# Patient Record
Sex: Female | Born: 1945 | Race: White | Hispanic: No | Marital: Single | Smoking: Current every day smoker
Health system: Southern US, Community
[De-identification: ages and names within clinical notes are randomized; demographics above are authoritative.]

## PROBLEM LIST (undated history)

## (undated) DIAGNOSIS — M810 Age-related osteoporosis without current pathological fracture: Secondary | ICD-10-CM

## (undated) DIAGNOSIS — F419 Anxiety disorder, unspecified: Secondary | ICD-10-CM

## (undated) DIAGNOSIS — F329 Major depressive disorder, single episode, unspecified: Secondary | ICD-10-CM

## (undated) DIAGNOSIS — E559 Vitamin D deficiency, unspecified: Secondary | ICD-10-CM

## (undated) DIAGNOSIS — R531 Weakness: Secondary | ICD-10-CM

## (undated) DIAGNOSIS — F32A Depression, unspecified: Secondary | ICD-10-CM

## (undated) DIAGNOSIS — N3281 Overactive bladder: Secondary | ICD-10-CM

## (undated) DIAGNOSIS — R413 Other amnesia: Secondary | ICD-10-CM

## (undated) DIAGNOSIS — K219 Gastro-esophageal reflux disease without esophagitis: Secondary | ICD-10-CM

## (undated) DIAGNOSIS — D696 Thrombocytopenia, unspecified: Secondary | ICD-10-CM

## (undated) DIAGNOSIS — J449 Chronic obstructive pulmonary disease, unspecified: Secondary | ICD-10-CM

## (undated) DIAGNOSIS — F259 Schizoaffective disorder, unspecified: Secondary | ICD-10-CM

## (undated) DIAGNOSIS — E039 Hypothyroidism, unspecified: Secondary | ICD-10-CM

## (undated) DIAGNOSIS — E569 Vitamin deficiency, unspecified: Secondary | ICD-10-CM

## (undated) DIAGNOSIS — N390 Urinary tract infection, site not specified: Secondary | ICD-10-CM

## (undated) DIAGNOSIS — F209 Schizophrenia, unspecified: Secondary | ICD-10-CM

## (undated) DIAGNOSIS — R339 Retention of urine, unspecified: Secondary | ICD-10-CM

## (undated) HISTORY — DX: Other amnesia: R41.3

## (undated) HISTORY — PX: BREAST SURGERY: SHX581

## (undated) HISTORY — DX: Depression, unspecified: F32.A

## (undated) HISTORY — DX: Major depressive disorder, single episode, unspecified: F32.9

## (undated) HISTORY — DX: Anxiety disorder, unspecified: F41.9

---

## 2003-12-15 ENCOUNTER — Emergency Department: Payer: Self-pay | Admitting: Internal Medicine

## 2004-05-30 ENCOUNTER — Inpatient Hospital Stay: Payer: Self-pay | Admitting: Orthopedic Surgery

## 2004-06-06 ENCOUNTER — Other Ambulatory Visit: Payer: Self-pay

## 2004-06-13 ENCOUNTER — Ambulatory Visit: Payer: Self-pay | Admitting: Internal Medicine

## 2004-06-19 ENCOUNTER — Ambulatory Visit: Payer: Self-pay | Admitting: Internal Medicine

## 2005-02-20 ENCOUNTER — Ambulatory Visit: Payer: Self-pay | Admitting: Internal Medicine

## 2005-04-09 ENCOUNTER — Inpatient Hospital Stay: Payer: Self-pay | Admitting: Internal Medicine

## 2005-04-09 ENCOUNTER — Other Ambulatory Visit: Payer: Self-pay

## 2005-04-15 ENCOUNTER — Emergency Department: Payer: Self-pay | Admitting: Emergency Medicine

## 2005-06-30 ENCOUNTER — Inpatient Hospital Stay: Payer: Self-pay | Admitting: Specialist

## 2005-06-30 ENCOUNTER — Other Ambulatory Visit: Payer: Self-pay

## 2005-09-12 ENCOUNTER — Emergency Department: Payer: Self-pay | Admitting: Emergency Medicine

## 2006-03-08 ENCOUNTER — Inpatient Hospital Stay: Payer: Self-pay | Admitting: Internal Medicine

## 2006-03-08 ENCOUNTER — Other Ambulatory Visit: Payer: Self-pay

## 2006-03-10 ENCOUNTER — Other Ambulatory Visit: Payer: Self-pay

## 2006-03-11 ENCOUNTER — Other Ambulatory Visit: Payer: Self-pay

## 2006-12-04 ENCOUNTER — Encounter: Payer: Self-pay | Admitting: Anesthesiology

## 2007-02-16 IMAGING — CR DG ABDOMEN 2V
1 series · 3 of 3 positions shown · non-contrast
Comparison: none

REASON FOR EXAM: Pain
COMMENTS:

[Series 1: view not recorded · 0.17mm/px · 3 of 3 slices shown]
[im 1/3]
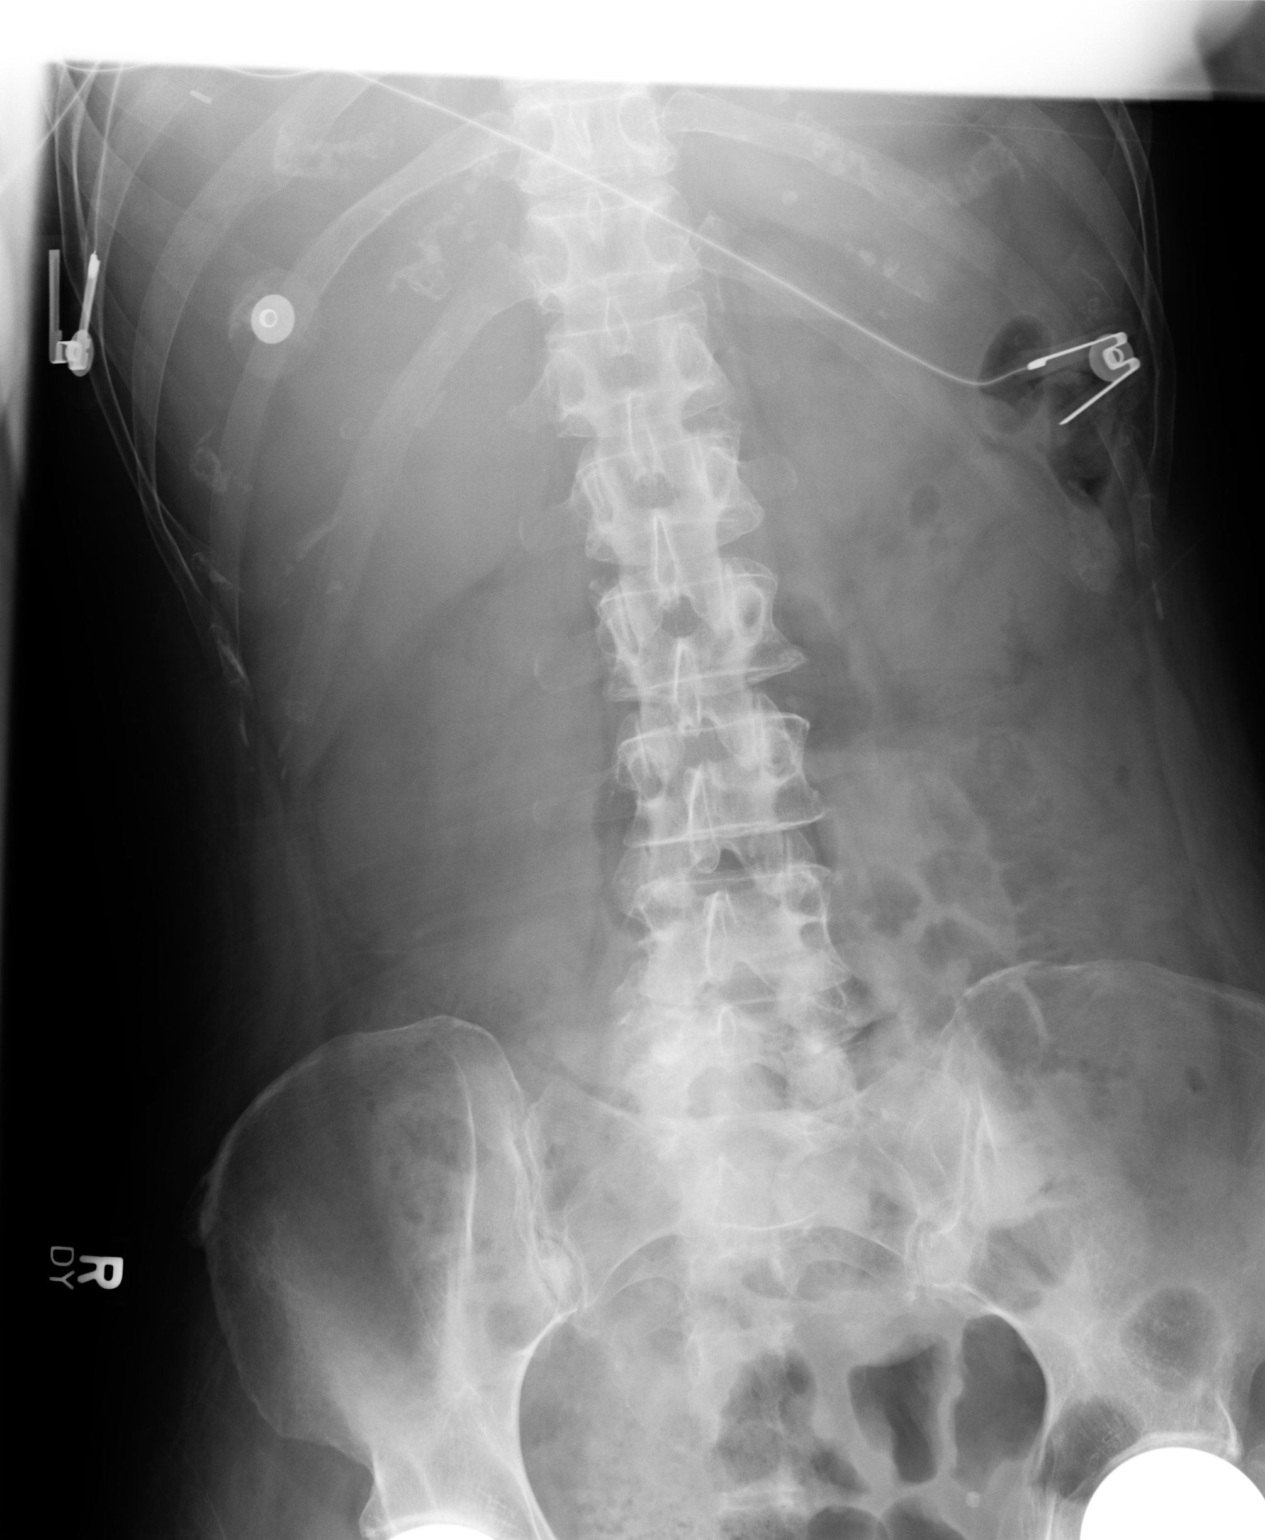
[im 2/3]
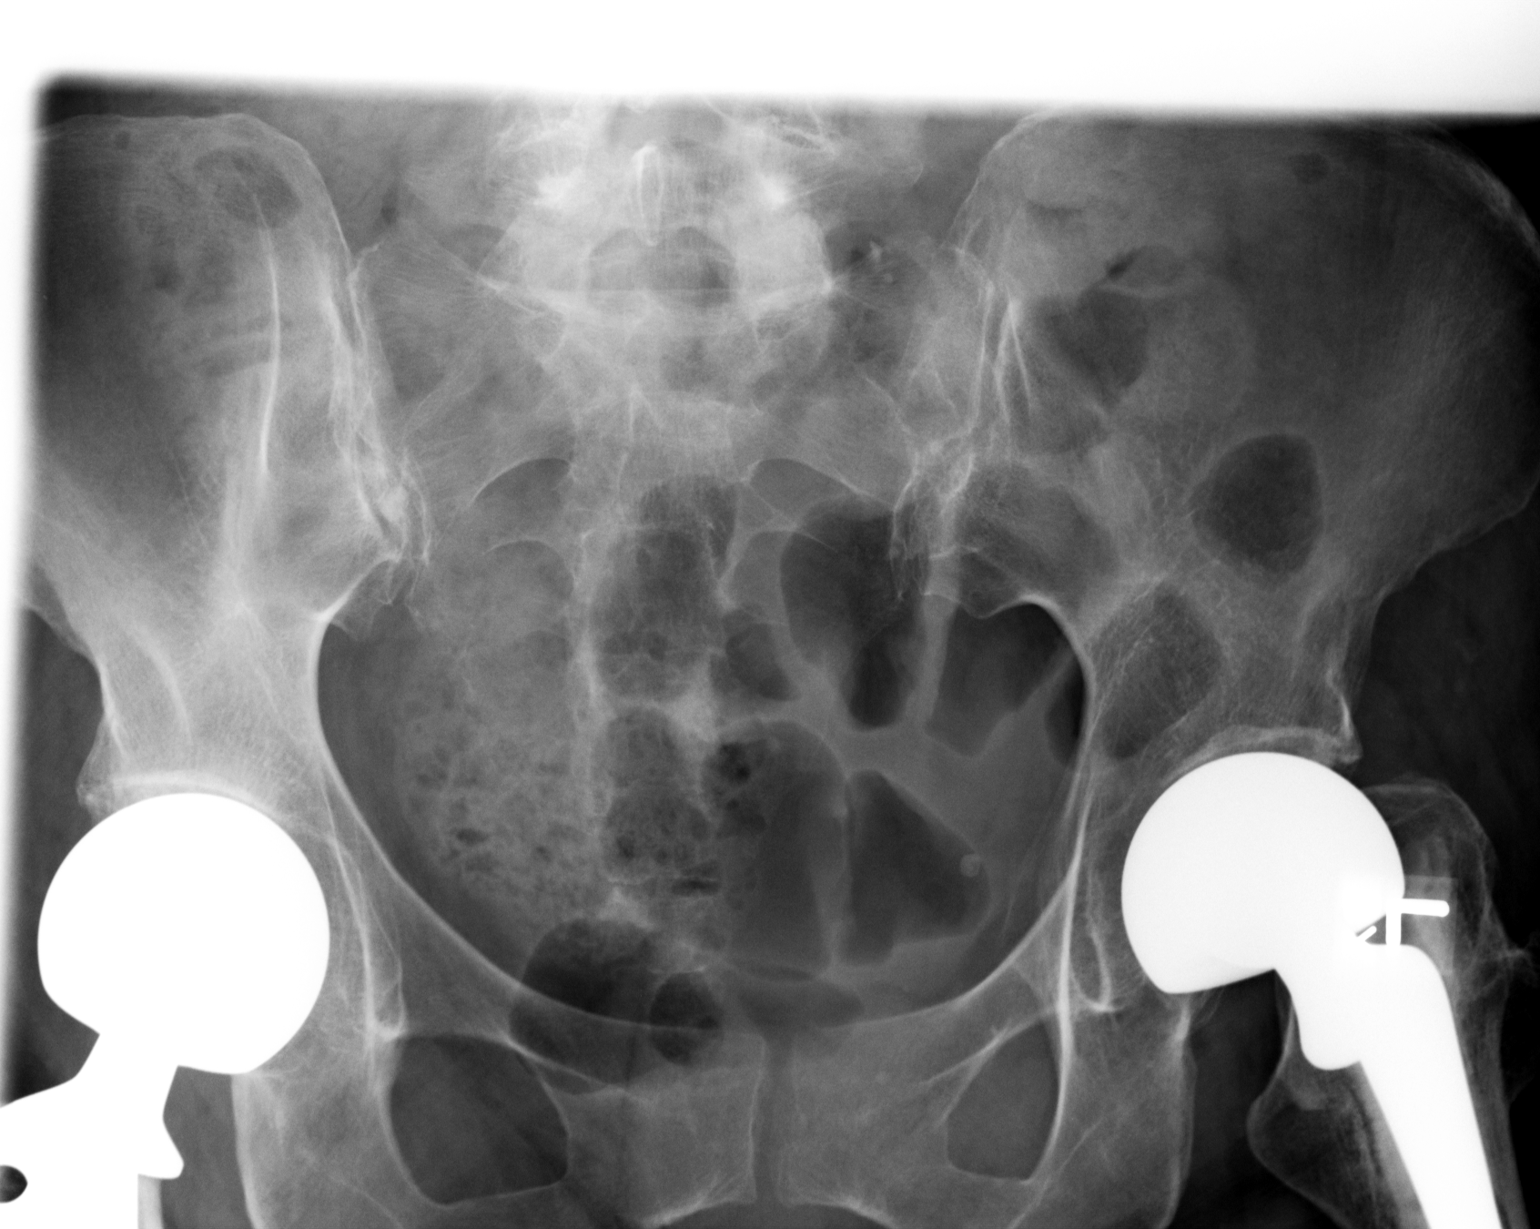
[im 3/3]
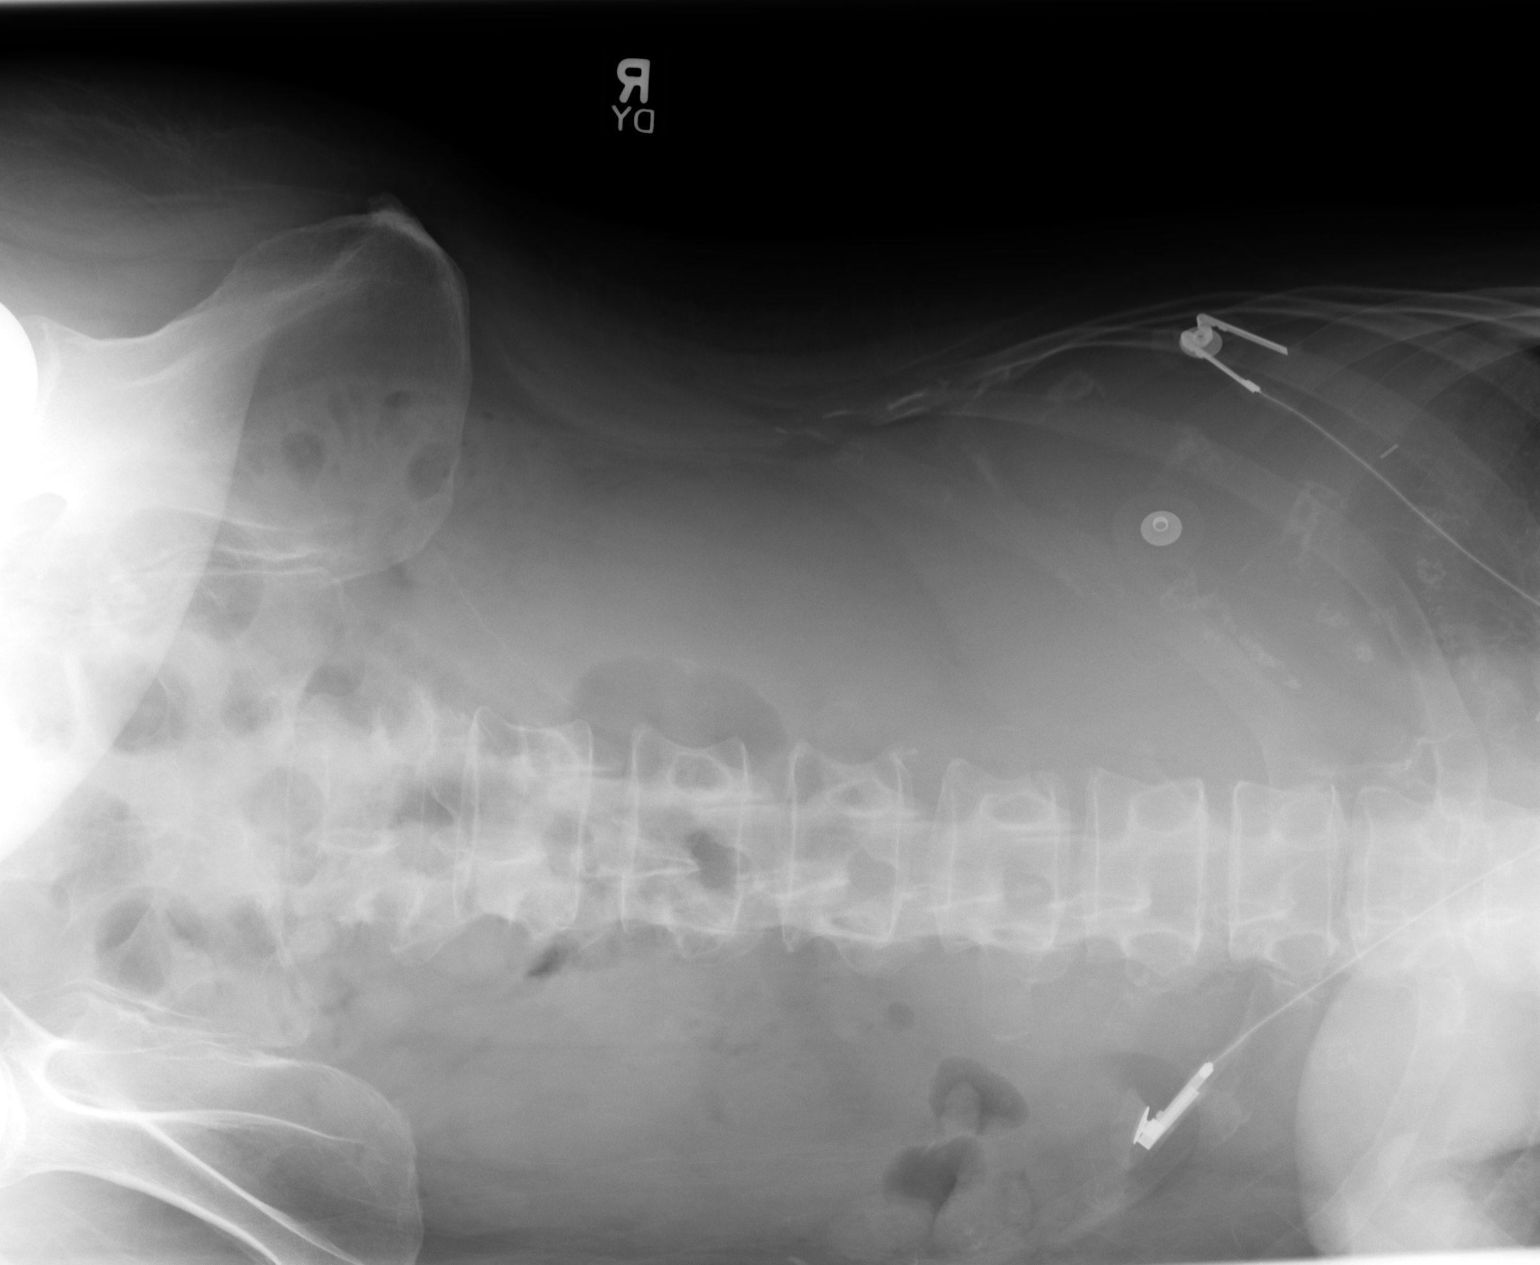

[3 of 3 positions shown; findings below may reference images not displayed]

PROCEDURE:     DXR - DXR ABDOMEN 2 V FLAT AND ERECT  - March 16, 2006  [DATE]

RESULT:

Supine and left-side-down decubitus films reveal a relatively nonspecific
bowel gas pattern. I see no evidence of obstruction or ileus. There are
prosthetic hips bilaterally. I see no abnormal soft tissue calcification.
IMPRESSION: The bowel gas pattern is nonspecific. I do not see evidence of obstruction
or ileus or perforation. Followup films are recommended if  the patient's
symptoms persist.

## 2007-06-23 ENCOUNTER — Emergency Department: Payer: Self-pay | Admitting: Emergency Medicine

## 2007-09-13 ENCOUNTER — Emergency Department: Payer: Self-pay | Admitting: Emergency Medicine

## 2007-10-05 ENCOUNTER — Inpatient Hospital Stay: Payer: Self-pay | Admitting: Internal Medicine

## 2008-01-04 ENCOUNTER — Inpatient Hospital Stay: Payer: Self-pay | Admitting: Internal Medicine

## 2008-01-07 ENCOUNTER — Inpatient Hospital Stay: Payer: Self-pay | Admitting: Unknown Physician Specialty

## 2008-04-06 ENCOUNTER — Emergency Department: Payer: Self-pay | Admitting: Emergency Medicine

## 2008-08-09 ENCOUNTER — Ambulatory Visit: Payer: Self-pay | Admitting: Anesthesiology

## 2010-05-02 ENCOUNTER — Emergency Department: Payer: Self-pay | Admitting: Emergency Medicine

## 2010-11-08 ENCOUNTER — Inpatient Hospital Stay: Payer: Self-pay | Admitting: Internal Medicine

## 2011-06-18 LAB — COMPREHENSIVE METABOLIC PANEL
Albumin: 2.7 g/dL — ABNORMAL LOW (ref 3.4–5.0)
BUN: 20 mg/dL — ABNORMAL HIGH (ref 7–18)
Bilirubin,Total: 0.3 mg/dL (ref 0.2–1.0)
Calcium, Total: 8.6 mg/dL (ref 8.5–10.1)
Chloride: 105 mmol/L (ref 98–107)
Co2: 29 mmol/L (ref 21–32)
Creatinine: 0.82 mg/dL (ref 0.60–1.30)
EGFR (Non-African Amer.): >60
Glucose: 105 mg/dL — ABNORMAL HIGH (ref 65–99)
Potassium: 3.8 mmol/L (ref 3.5–5.1)
SGOT(AST): 17 U/L (ref 15–37)
SGPT (ALT): 8 U/L — ABNORMAL LOW
Total Protein: 6.4 g/dL (ref 6.4–8.2)

## 2011-06-18 LAB — CBC
MCH: 29.6 pg (ref 26.0–34.0)
MCHC: 33 g/dL (ref 32.0–36.0)
MCV: 90 fL (ref 80–100)
RBC: 4.69 10*6/uL (ref 3.80–5.20)
RDW: 14.1 % (ref 11.5–14.5)
WBC: 9.9 10*3/uL (ref 3.6–11.0)

## 2011-06-18 LAB — URINALYSIS, COMPLETE
Blood: NEGATIVE
Glucose,UR: NEGATIVE mg/dL (ref 0–75)
Leukocyte Esterase: NEGATIVE
Nitrite: NEGATIVE
Ph: 6 (ref 4.5–8.0)
RBC,UR: 7 /HPF (ref 0–5)
Specific Gravity: 1.021 (ref 1.003–1.030)
Squamous Epithelial: 3
WBC UR: 1 /HPF (ref 0–5)

## 2011-06-18 LAB — DRUG SCREEN, URINE
Amphetamines, Ur Screen: NEGATIVE (ref ?–1000)
Barbiturates, Ur Screen: NEGATIVE (ref ?–200)
Opiate, Ur Screen: NEGATIVE (ref ?–300)

## 2011-06-18 LAB — TROPONIN I: Troponin-I: <0.02 ng/mL

## 2011-06-18 LAB — T4, FREE: Free Thyroxine: 1.04 ng/dL (ref 0.76–1.46)

## 2011-06-18 LAB — VALPROIC ACID LEVEL: Valproic Acid: 91 ug/mL

## 2011-06-18 LAB — ETHANOL
Ethanol %: <0.003 % (ref 0.000–0.080)
Ethanol: <3 mg/dL

## 2011-06-18 LAB — TSH: Thyroid Stimulating Horm: 3.71 u[IU]/mL

## 2011-06-18 LAB — SALICYLATE LEVEL: Salicylates, Serum: <1.7 mg/dL

## 2011-06-19 ENCOUNTER — Inpatient Hospital Stay: Payer: Self-pay | Admitting: *Deleted

## 2011-06-19 LAB — CBC WITH DIFFERENTIAL/PLATELET
Basophil %: 0.1 %
Eosinophil #: 0 10*3/uL (ref 0.0–0.7)
Eosinophil %: 0.6 %
HCT: 37.4 % (ref 35.0–47.0)
Lymphocyte %: 35 %
MCHC: 33.6 g/dL (ref 32.0–36.0)
Monocyte #: 0.8 x10 3/mm (ref 0.2–0.9)
Monocyte %: 11.1 %
Neutrophil %: 53.2 %
Platelet: 110 10*3/uL — ABNORMAL LOW (ref 150–440)
RDW: 13.9 % (ref 11.5–14.5)

## 2011-06-19 LAB — BASIC METABOLIC PANEL
Anion Gap: 6 — ABNORMAL LOW (ref 7–16)
BUN: 14 mg/dL (ref 7–18)
Calcium, Total: 8.2 mg/dL — ABNORMAL LOW (ref 8.5–10.1)
EGFR (Non-African Amer.): >60
Glucose: 118 mg/dL — ABNORMAL HIGH (ref 65–99)
Osmolality: 279 (ref 275–301)
Sodium: 139 mmol/L (ref 136–145)

## 2012-02-15 LAB — BASIC METABOLIC PANEL
Anion Gap: 6 — ABNORMAL LOW (ref 7–16)
Chloride: 102 mmol/L (ref 98–107)
Co2: 29 mmol/L (ref 21–32)
Creatinine: 0.88 mg/dL (ref 0.60–1.30)
EGFR (African American): >60
EGFR (Non-African Amer.): >60
Glucose: 130 mg/dL — ABNORMAL HIGH (ref 65–99)
Potassium: 4.3 mmol/L (ref 3.5–5.1)
Sodium: 137 mmol/L (ref 136–145)

## 2012-02-15 LAB — CBC
HCT: 41.3 % (ref 35.0–47.0)
MCH: 29.5 pg (ref 26.0–34.0)
MCHC: 32.7 g/dL (ref 32.0–36.0)
Platelet: 115 10*3/uL — ABNORMAL LOW (ref 150–440)
RBC: 4.58 10*6/uL (ref 3.80–5.20)

## 2012-02-16 ENCOUNTER — Inpatient Hospital Stay: Payer: Self-pay | Admitting: Internal Medicine

## 2012-02-16 LAB — URINALYSIS, COMPLETE
Nitrite: POSITIVE
Protein: 30
Specific Gravity: 1.012 (ref 1.003–1.030)
WBC UR: 196 /HPF (ref 0–5)

## 2012-02-16 LAB — RAPID INFLUENZA A&B ANTIGENS

## 2012-02-18 LAB — URINE CULTURE

## 2012-02-21 LAB — CULTURE, BLOOD (SINGLE)

## 2012-08-31 ENCOUNTER — Inpatient Hospital Stay: Payer: Self-pay | Admitting: Internal Medicine

## 2012-08-31 LAB — URINALYSIS, COMPLETE
Ketone: NEGATIVE
Nitrite: NEGATIVE
Protein: NEGATIVE
RBC,UR: <1 /HPF (ref 0–5)
WBC UR: 2 /HPF (ref 0–5)

## 2012-08-31 LAB — CBC WITH DIFFERENTIAL/PLATELET
Basophil #: 0 10*3/uL (ref 0.0–0.1)
Eosinophil #: 0 10*3/uL (ref 0.0–0.7)
Eosinophil %: 0.1 %
HCT: 36.5 % (ref 35.0–47.0)
HGB: 12.6 g/dL (ref 12.0–16.0)
Lymphocyte #: 1.6 10*3/uL (ref 1.0–3.6)
MCHC: 34.5 g/dL (ref 32.0–36.0)
MCV: 89 fL (ref 80–100)
Monocyte #: 2.6 x10 3/mm — ABNORMAL HIGH (ref 0.2–0.9)
Monocyte %: 20.6 %
Neutrophil #: 8.4 10*3/uL — ABNORMAL HIGH (ref 1.4–6.5)
Neutrophil %: 66.1 %
RBC: 4.11 10*6/uL (ref 3.80–5.20)

## 2012-08-31 LAB — COMPREHENSIVE METABOLIC PANEL
Albumin: 2.7 g/dL — ABNORMAL LOW (ref 3.4–5.0)
Alkaline Phosphatase: 87 U/L (ref 50–136)
Anion Gap: 9 (ref 7–16)
Calcium, Total: 8.8 mg/dL (ref 8.5–10.1)
Chloride: 101 mmol/L (ref 98–107)
Co2: 27 mmol/L (ref 21–32)
Creatinine: 0.89 mg/dL (ref 0.60–1.30)
Osmolality: 275 (ref 275–301)
Potassium: 3.6 mmol/L (ref 3.5–5.1)
SGPT (ALT): 12 U/L (ref 12–78)
Total Protein: 6.5 g/dL (ref 6.4–8.2)

## 2012-09-05 LAB — CULTURE, BLOOD (SINGLE)

## 2013-01-01 ENCOUNTER — Encounter: Payer: Self-pay | Admitting: Podiatrist

## 2013-01-08 ENCOUNTER — Ambulatory Visit: Payer: Self-pay | Admitting: Podiatrist

## 2013-01-29 ENCOUNTER — Ambulatory Visit: Payer: Self-pay | Admitting: Podiatrist

## 2014-04-06 ENCOUNTER — Inpatient Hospital Stay: Payer: Self-pay | Admitting: Internal Medicine

## 2014-04-13 ENCOUNTER — Emergency Department: Payer: Self-pay | Admitting: Emergency Medicine

## 2014-06-08 NOTE — H&P (Signed)
PATIENT NAME:  Tina Ellison, Tina Ellison MR#:  [id number] DDaine FlorasE OF BIRTH:  September 04, 1945  DATE OF ADMISSION:  02/16/2012  PRIMARY CARE PHYSICIAN: Mayer Maskerobert Mead, MD  REFERRING PHYSICIAN: Lucrezia EuropeAllison Webster, MD.   CHIEF COMPLAINT: Altered mental status.   HISTORY OF PRESENT ILLNESS:  The patient is a 69 year old Caucasian female who lives in a nursing assisted facility. She has a history of schizoaffective disorder and bipolar disorder. She is disabled due to that and history of chronic obstructive pulmonary disease.  The patient noted lately that her mental status is deteriorating. She is more confused, and her ability to walk and to be active is also diminished. Additionally, she is noticed to be more shaky and also noticed to have some cough. The patient is known to be a smoker but is unclear whether she is still smoking or not. The patient was brought to the Emergency Department for evaluation. The patient does not provide any meaningful history.  She is sparing on the ceiling and mumbles some words and her answers are not correlating with the questions.   REVIEW OF SYSTEMS:   A 10-point system review is unobtainable and not reliable due to patient confusion and altered mental status.   PAST MEDICAL HISTORY: 1.  Chronic obstructive pulmonary disease.  2.  Tobacco abuse. 3.  Schizoaffective disorder. 4.  Bipolar disorder.  5.  Anxiety.  6.  Hypothyroidism.  7.  Gastroesophageal reflux disease.  8.  Osteoporosis. 9.  Thrombocytopenia.   PAST SURGICAL HISTORY:  Right breast mastectomy, left hemiarthroplasty and right hemiarthroplasty.   SOCIAL HABITS:  Chronic smoker. It is unclear whether she is still smoking or not. I cannot obtain information whether she drinks alcohol or not.   SOCIAL HISTORY: She lives in a nursing-assisted facility and group home. She is disabled. According to the old records, she never married.   ADMISSION MEDICATIONS:  Omeprazole 20 mg once a day, Fosamax 70 mg once a week, Tylenol 500  mg 1 to 2 tablets q.6 hours p.r.n., olanzapine 5 mg once a day and also given once a day to twice a day as needed, folic acid 1 mg a day, Divalproex 500 mg twice a day, aspirin 325 mg a day.   ALLERGIES:   ACCORDING TO THE OLD RECORDS, SEROQUEL, HALOPERIDOL AND CLOZARIL.  PHYSICAL EXAMINATION: VITAL SIGNS: Blood pressure 119/54, respiratory rate 20, pulse 116, and temperature 98.2, oxygen saturation 100%.  GENERAL APPEARANCE: Elderly female, thin looking, lying down in bed opening her eyes and does not interact much, in no acute distress.  HEAD AND NECK: No pallor. No icterus. No cyanosis.  ENT: Hearing is normal, no lesions no discharge. Nasal mucosa was normal. No ulcers. No discharge. Oropharyngeal area showed no oral thrush. No exudate. She is edentulous and not wearing any dentures.  EYES: Difficult to perform due to the resistance and uncooperation.  NECK: Supple. Trachea at midline. No thyromegaly. No cervical lymphadenopathy. No masses.  HEART: Normal S1, S2. No S3, S4. No murmur. No gallop. No carotid bruits.  RESPIRATORY: Normal breathing pattern without use of accessory muscles. No rales. No wheezing.  BREASTS:  She has a right breast mastectomy scar tissue, the area looks clean.  ABDOMEN: Soft without tenderness. No hepatosplenomegaly. No masses. No hernias.  SKIN: No ulcers. No subcutaneous nodules.  MUSCULOSKELETAL: No joint swelling. No clubbing.  NEUROLOGIC: Cranial nerves II through XII are intact. No focal motor deficit.  PSYCHIATRIC: The patient is not making any sense in her statements. She does not keep an  eye contact.   LABORATORY AND DIAGNOSTIC DATA:  CT scan of the head showed no acute intracranial abnormality. Chest x-ray showed no consolidation, no effusion. Serum glucose 113, BUN 19, creatinine 0.8, sodium 137, potassium 4.3.  Lithium level was less than 0.2. CBC showed white count of 13,000, hemoglobin 13, hematocrit 41, platelet count 115. The patient is known in the  past to have thrombocytopenia. Urinalysis showed cloudy urine with 196 white blood cells, +2 bacteria, 33 red blood cells and +3 leukocyte esterase, positive nitrites.   ASSESSMENT: 1.  Altered mental status.  2.  Urinary tract infection.  3.  Schizoaffective disorder.   4.  Bipolar disorder.  5.  Thrombocytopenia. This is also noted in the past records.  6. Her other medical problems include hypothyroidism, gastroesophageal reflux disease, osteoporosis, chronic obstructive pulmonary disease and tobacco abuse.   PLAN: Will admit the patient to the medical floor. IV hydration was started. Blood cultures x2 were taken, urine culture as well. Started on intravenous Rocephin 1 gram daily. Continue the home medications. Neuro checks q.2 hours x 4 then q.4 hours.  Regarding her thrombocytopenia, her platelet count now is 115.  In April of this year it was 110.   Time spent in evaluating this patient took more than 50 minutes.    ____________________________ Carney Corners. Rudene Re, MD amd:ct D: 02/16/2012 04:32:31 ET T: 02/17/2012 10:39:39 ET JOB#: [id number]  cc: Carney Corners. Rudene Re, MD, <Dictator> Zollie Scale MD ELECTRONICALLY SIGNED 02/17/2012 21:39

## 2014-06-11 NOTE — Consult Note (Signed)
PATIENT NAME:  Tina Ellison, Jeff MR#:  [id number] DATE OF BIRTH:  Feb 17, 1946  DATE OF CONSULTATION:  02/16/2012  PLACE OF DICTATION: [facility], Room [address], [place].   CONSULTING PHYSICIAN:  Brandice Busser K. Guss Bundehalla, MD    Subjective: The patient is a 69 year old white female, not employed, and has a long history of mental illness and has [facility] for a diagnosis of schizoaffective disorder.  Chief complaint: "Police brought me here."  A floor nurse reports that police did not bring her here and that [facility] her here because of confusion, altered mental status, and she had a UTI which is being treated.  However, the nurse reports that the patient refuses to take the Zyprexa and does not give the reason why she does not take it.   OBJECTIVE: The patient is lying comfortably in bed, in hospital clothes.  Alert but confused and she knew this was a hospital.  Denies feeling depressed.  Denies feeling hopeless or helpless.  Pt was ,not able to focus.  Her memory could not be tested because of her confusion. Denies any auditory or visual hallucinations.  She could not count money and all she said was, "police brought me here, I wanted to be here."  Insight and judgment impaired.    IMPRESSION: Schizoaffective disorder with psychosis and confusion.  The patient is on the following medication: olanzapine 5 mg at bedtime, olanzapine 5 mg b.i.d. as needed for agitation, Depakote 500 mg p.o. b.i.d. Risperidol Consta 25 mg every 2 weeks intramuscularly.  If the patient refuses to get the Zyprexa 5 mg, she can be given Zydis form in the same dose as this dissolves very easily under the tongue and is very fast acting.  If she continues to refuse even Zydis, then she can be given intramuscular Zyprexa.  Once she starts taking medication, she should improve.  Once urinary tract infection is resolved, confusion should improve.             ____________________________ Jannet MantisSurya K. Guss Bundehalla, MD skc:th D: 02/16/2012 19:31:29 ET T: 02/17/2012 18:25:21 ET JOB#: [id number]  cc: Monika SalkSurya K. Guss Bundehalla, MD, <Dictator> Beau FannySURYA K Lorra Freeman MD ELECTRONICALLY SIGNED 02/20/2012 20:29

## 2014-06-11 NOTE — H&P (Signed)
PATIENT NAME:  Tina Ellison, Tina Ellison MR#:  [id number] DATE OF BIRTH:  30-Jan-1946  DATE OF ADMISSION:  08/31/2012  PRIMARY CARE PHYSICIAN:  Dr.  Hoyle Sauer.   REFERRING PHYSICIAN: Dr. Dolores Frame.   CHIEF COMPLAINT: Productive cough, fever and shortness of breath with wheezing    HISTORY OF PRESENT ILLNESS: The patient is a 69 year old (turning 29 today) Caucasian female with past medical history of schizoaffective disorder, COPD,  hypothyroidism and  multiple other medical problems, is sent over from care home for fever and productive cough. The patient spiked a temperature of 101 degrees Fahrenheit and has been experiencing productive cough with greenish-yellow phlegm for the past 2 days. Denies any chest pain, abdominal pain, but this was associated with shortness of breath and wheezing. The patient is reporting that her blood pressure usually runs low and her normal systolic is at around 120s. In the ER, chest x-ray revealed infiltrate and as the patient was wheezing and short of breath, she was given Solu-Medrol 125 mg IV and DuoNeb breathing treatment. With blood pressure being low, IV fluids were ordered. The patient was given IV LEVOFLOXACIN; FOLLOWING THAT, SHE DEVELOPED SOME ITCHING, REGARDING WHICH IV LEVOFLOXACIN WAS DISCONTINUED. No family members are at bedside. The patient denies any chest pain, abdominal pain, nausea, vomiting or diarrhea. No other complaints.   PAST MEDICAL HISTORY: COPD, tobacco dependence, schizoaffective disorder, anxiety hypothyroidism, thrombocytopenia, osteoporosis, bipolar disorder, GERD.   PAST SURGICAL HISTORY: Right breast mastectomy, left hemiarthroplasty and right hemiarthroplasty.   ALLERGIES: THE PATIENT HAS NO KNOWN DRUG ALLERGIES.   PSYCHOSOCIAL HISTORY: Chronic smoker, still smoking 10 cigarettes per day. Denies alcohol or illicit drug usage. Lives in a care home regarding her psychiatric problems. She never married.   FAMILY HISTORY: Hypertension runs in her  family.   HOME MEDICATIONS: Risperdal 25 mg intramuscularly every 2 weeks at 8:00 a.m. oxybutynin 1 tablet p.o. once daily, folic acid 1 mg once daily, Divalproex sodium 500 mg 1 tablet 2 times a day, BuSpar 10 mg once daily, alendronate once a week, Advil 200 mg 2 tablets 2 times a day.  REVIEW OF SYSTEMS:  CONSTITUTIONAL: Denies any fatigue, but complaining of fever. No weight loss.  EYES: Denies any blurry vision, glaucoma.  EARS, NOSE, THROAT: No epistaxis, discharge, snoring.  RESPIRATORY: Complaining of productive cough with greenish-yellow phlegm, has chronic history of COPD.   CARDIOVASCULAR: No chest pain, palpitations or syncope.  GASTROINTESTINAL: Denies nausea, vomiting, diarrhea.  GENITOURINARY: No dysuria, hematuria.  GYNECOLOGICAL/BREASTS: Denies breast mass, vaginal discharge.  ENDOCRINE: No polyuria, nocturia or thyroid problems.  HEMATOLOGIC AND LYMPHATIC:  Denies anemia, easy bruising, bleeding.  INTEGUMENTARY: No acne, rash, lesions.  MUSCULOSKELETAL: No joint pain in the neck, back.  Denies any gout.  NEUROLOGIC: Denies any history of vertigo, ataxia, dementia.  PSYCHIATRIC: Denies any ADD, OCD, but has history of chronic schizoaffective disorder and bipolar disorder.   PHYSICAL EXAMINATION: VITAL SIGNS: Temperature 99.2, pulse 100, respirations 22, blood pressure 86/48, pulse oximetry 97%. Currently, the patient is getting fluid bolus.  GENERAL APPEARANCE: Not in any acute distress. Moderately built, thin-looking female.  HEENT: Normocephalic, atraumatic. Pupils are equally reacting to light and accommodation. No scleral icterus. No conjunctival injection. No sinus tenderness. No postnasal drip.  NECK: Supple. No JVD. Range of motion is intact. No thyromegaly.  LUNGS: Moderate air entry. Positive crackles and rales. No accessory muscle usage. No anterior chest wall tenderness on palpation.  CARDIAC: S1, S2 normal. Regular rate and rhythm. No murmurs.   GASTROINTESTINAL:  Soft. Bowel sounds are positive in all 4 quadrants. Nontender, nondistended. No masses felt. No hepatosplenomegaly. NEUROLOGICAL:  Awake, alert, oriented x3.  Cranial nerves II through XII are intact grossly. Motor and sensory are intact grossly. Reflexes are 2+.  EXTREMITIES: No edema. No cyanosis. No clubbing.  SKIN: Warm to touch. Normal turgor. No rashes. No lesions. BACK:  No CVA tenderness.  PSYCHIATRIC: Flat affect.  INTEGUMENTARY: No rashes. No lesions. Normal skin turgor.   LABORATORY AND IMAGING STUDIES:  WBC 12.7, hemoglobin 12.6, hematocrit 36.5, platelets 109; and according to old records, she has chronic thrombocytopenia. Troponin less than 0.02. LFTs within normal range, except albumin at 2.7. BMP is normal except for glucose at 125.  Twelve-lead EKG: Sinus tachycardia at 105 beats per minute, normal PR and QRS intervals .  Chest x-ray: Left lower lobe infiltrate.   ASSESSMENT AND PLAN: A 69 year old (turning [age] today) Caucasian female presenting to the ER with chief complaint of productive cough, with shortness of breath and wheezing will be admitted for the following assessment and plan.  1.  Pneumonia, most likely healthcare-associated.  Pan cultures including sputum cultures were ordered. We will give antibiotics Levaquin and vancomycin.  2.  Acute exacerbation of COPD:  Solu-Medrol 125 mg was given in the IV form in the ER. We will continue Solu-Medrol in the IV form and nebulizer treatments.  3.  Hypotension. We will provide her IV fluids and monitor blood pressure closely. If necessary, we will support her with pressors.  4.  Hypothyroidism. We will check TSH.  5.  Gastroesophageal reflux disease.  Provide gastrointestinal prophylaxis.  6.  Schizoaffective disorder. Continue her home medications.  7.  We will provide her deep vein thrombosis prophylaxis.  8.  She is FULL CODE. Her brother is her medical POA.   TOTAL TIME SPENT ON ADMISSION: 50 minutes.  The diagnosis and plan of care was discussed in detail with the patient and she is aware of the plan.     ____________________________ Ramonita LabAruna Ison Wichmann, MD ag:nts D: 08/31/2012 03:29:15 ET T: 08/31/2012 04:04:49 ET JOB#: [id number]  cc: Ramonita LabAruna Carmon Brigandi, MD, <Dictator> Ramonita LabARUNA Lyndsi Altic MD ELECTRONICALLY SIGNED 09/13/2012 1:16

## 2014-06-11 NOTE — Discharge Summary (Signed)
PATIENT NAME:  Tina Ellison, Tina Ellison MR#:  [id number] DATE OF BIRTH:  18-Jan-1946  DATE OF ADMISSION:  02/16/2012 DATE OF DISCHARGE:  02/19/2012  DISCHARGE DIAGNOSES: Altered mental status secondary to urinary tract infection, metabolic encephalopathy due to Escherichia coli urinary tract infection, schizoaffective disorder with anxiety, chronic obstructive pulmonary disease, gastroesophageal reflux disease, and chronic thrombocytopenia.   CONSULTATIONS:  1. Psychiatry consult with Dr. Guss Bundehalla.  2. The patient has an appointment with Oncology regarding thrombocytopenia on 02/25/2012.   DISCHARGE MEDICATIONS: Cipro 250 mg p.o. b.i.d. for 10 days, omeprazole 20 mg p.o. daily, Fosamax 70 mg once a week, olanzapine 5 mg once a day to twice a day as needed, folic acid 1 mg daily, Depakote 500 mg p.o. b.i.d., aspirin 325 mg p.o. daily.   CODE STATUS: Full code.   HOSPITAL COURSE: The patient is a 69 year old female patient with schizoaffective disorder, COPD, came because of change in mental status and inability to walk. Look at the history and physical for full details. The patient's CAT scan of the head did not show any acute changes. Chest x-ray clear of any consolidation. UA showed WBC of 19 with 2+ bacteria and leukocyte esterase. The patient's white count was 13,000 on admission. She was admitted for altered mental status with metabolic encephalopathy with UTI. Started on IV fluids, started on IV Rocephin. The patient's temperature was 101 when she came. Influenza test was negative. The patient's blood count also improved with IV Rocephin. The patient's initial WBC 13.5, but her urine culture showed E. coli UTI sensitive to all the antibiotics. The patient refused further blood work but has been afebrile and her mental status is back to baseline. She was also seen by Dr. Guss Bundehalla who recommended to continue her olanzapine that she is on. The patient can be [facility] and she  has a rolling walker that she can use there. Hardcopy as well was signed in the chart. Dr. Guss Bundehalla recommended for her psychosis and confusion, she can take olanzapine 5 mg at bedtime and 5 mg p.o. b.i.d. as needed for agitation. In case insurance refuses Zyprexa, she can get Zydis in the same dose (Dictation Anomaly) dissolve this under the tongue. If she refuses even Zydis, she can be given IM Zyprexa and the patient be stable for discharge.   Time spent on discharge preparation: More than 30 minutes.  CODE STATUS: Full code.     ____________________________ Katha HammingSnehalatha Saroya Riccobono, MD sk:es D: 02/19/2012 11:24:46 ET T: 02/19/2012 11:45:29 ET JOB#: [id number]  cc: Katha HammingSnehalatha Adriene Knipfer, MD, <Dictator> Katha HammingSNEHALATHA Kendrick Remigio MD ELECTRONICALLY SIGNED 03/17/2012 8:54

## 2014-06-11 NOTE — Discharge Summary (Signed)
PATIENT NAME:  Tina Ellison, Tina MR#:  [id number] DATE OF BIRTH:  Nov 06, 1945  DATE OF ADMISSION:  08/31/2012 DATE OF DISCHARGE:  09/01/2012  PRIMARY CARE PHYSICIAN: Dr. Mayer Maskerobert Mead.  PRESENTING COMPLAINT: Shortness of breath and cough.   DISCHARGE DIAGNOSES:  1.  Acute chronic obstructive pulmonary disease flare.  2.  Acute bronchitis.  3.  Schizoaffective disorder. Sats 98% on room air.   CODE STATUS: Full code.   MEDICATIONS:  1.  Folic acid 1 mg daily.  2.  Depakote 500 mg extended-release b.i.d.  3.  Risperdal 25 mg intramuscular every 2 weeks as directed.  4.  Advil 200 mg 2 tablets twice a day with meals as needed.  5.  Oxybutynin extended release 10 mg daily.  6.  Buspirone 10 mg daily.  7.  Prednisone taper as directed.  8.  Augmentin 875 mg b.i.d.   DIET: Regular, mechanical soft.   DISCHARGE FOLLOWUP: With Dr. Bethena MidgetMead in 1 to 2 weeks.   LABORATORIES: UA negative for UTI. Urine culture negative in 18 to 24 hours. Blood cultures negative in 48 hours. White count is 12.7. Comprehensive metabolic panel is within normal limits. Troponin is 0.02. Chest x-ray: There is no acute cardiopulmonary disease, stable appearance.   HOSPITAL COURSE: Tina Florasmily Mifsud is a 69 year old, who has history of schizoaffective disorder, who comes in with: 1.  Acute COPD flare. She was given IV steroids, inhalers and weaned off her oxygen. The patient improved. Her saturations remained at 98% on room air. Her steroids were changed to p.o. taper.  2.  Acute bronchitis with negative chest x-ray. P.o. Augmentin was given. The patient did not have any fever. Her white count remained stable.  3.  Chronic schizoaffective disorder. The patient's Depakote was continued. She will follow up with Dr. Bethena MidgetMead as an outpatient. She is discharged back to her group home.   TIME SPENT: 40 minutes. ____________________________ Wylie HailSona A. Allena KatzPatel, MD sap:aw D: 09/02/2012 07:46:04 ET T: 09/02/2012 08:21:32  ET JOB#: [id number]  cc: Cornie Mccomber A. Allena KatzPatel, MD, <Dictator> Mickel Crowobert J. Mead, MD Willow OraSONA A Lois Ostrom MD ELECTRONICALLY SIGNED 09/08/2012 19:07

## 2014-06-13 NOTE — Consult Note (Signed)
See med changes and lab orders. Stat dictation done.  Electronic Signatures: Airis Barbee, Adelene AmasJames S (MD)  (Signed on 30-Apr-13 10:46)  Authored  Last Updated: 30-Apr-13 10:46 by Lester CarolinaWilliford, Jomaira Darr S (MD)

## 2014-06-13 NOTE — Consult Note (Signed)
PATIENT NAME:  Tina Ellison, Tina Ellison MR#:  [id number] DATE OF BIRTH:  03-16-1945  DATE OF CONSULTATION:  06/19/2011  REFERRING PHYSICIAN:  Dr. Jacques Navy  CONSULTING PHYSICIAN:  Adelene Amas. Nikira Kushnir, MD  REASON FOR CONSULTATION: Possible overdose.   HISTORY OF PRESENT ILLNESS: Ms. Kothari is a 69 year old female admitted to the [facility] on 06/18/2011 with altered mental status. She was speaking unintelligible words as well as not able to provide a history.   Today she continues to have disorganized thought process mixed with short intelligible phrases. She has very soft speech and poverty of speech. She has cloudiness of consciousness and she is picking at things not there. She will not cooperate with questions about orientation, however, she appears grossly to be only oriented to person. Please see the mental status exam.   The duration of this set of symptoms is not known, however, she does have a history of documented schizoaffective disorder and treatment with standing Zyprexa, standing valproic acid, and Risperdal 25 mg injection q.2 weeks. Please see the medication section.   PAST PSYCHIATRIC HISTORY: She has been diagnosed with schizoaffective disorder. In order to achieve control, she has required a multi-medication regimen as discussed. She has undergone several psychiatric hospitalizations and has undergone several antipsychotic trials. She has been on Clozapine before without consistent improvement. She has been hospitalized at [facility] in the past. Of note, she also has been tried on lithium.   FAMILY PSYCHIATRIC HISTORY: None known.   SOCIAL HISTORY: She has resided in a group home. No children. No history of marriage. There are no illegal drugs or alcohol.   PAST MEDICAL HISTORY: 1. Osteoporosis.  2. Gastroesophageal reflux disease. 3. Chronic obstructive pulmonary disease. 4. History of right  hemiarthroplasty. 5. History of thrombocytopenia.  6. History of left hemiarthroplasty. 7. Right mastectomy. 8. Right breast implant.   CURRENT MEDICATIONS: The MAR is reviewed. She is being maintained on Zyprexa 10 mg b.i.d. as well as Depakote 500 mg b.i.d.   LABORATORY DATA: Platelet count mildly decreased at 110. Hemoglobin 12.6. Ammonia was normal at 29.   Head CT without contrast showed no intracranial abnormality. Mild ventriculomegaly was noted.   TSH was normal. Urinalysis was unremarkable.   Chest x-ray unremarkable.   Aspirin negative. Tylenol negative. Depakote level 91. Urine drug screen negative   REVIEW OF SYSTEMS: Constitutional, HEENT, mouth, neurologic, psychiatric, cardiovascular, respiratory, gastrointestinal, genitourinary, skin, musculoskeletal, hematologic, lymphatic, endocrine, metabolic all unremarkable.    PHYSICAL EXAMINATION:   VITAL SIGNS: Temperature 97.9, pulse 84, respiratory rate 20, blood pressure 112/76, oxygen saturation on 2 liters 92%.   GENERAL APPEARANCE: Ms. Brenton is an elderly female lying in a partially reclined position in her hospital bed with no abnormal involuntary movement. She has normal muscle tone. Her grooming and hygiene are normal.   She has clouding of consciousness and poor eye contact. Please see the history of present illness. She does not participate in specific questions of orientation, memory, insight, judgment, or abstraction. Her speech is very soft. There is thought blocking. Thought process there is disorganization mixed with very short coherent phrases; "my mother told me about you. She said that you were not real and tricky". Thought content she is having visual hallucinations. There is blocking. She is having some delusions as noted in the thought process section . Her insight is poor. Judgment impaired. Affect mildly irritable. Mood is mildly irritable.   ASSESSMENT:  AXIS I:  1. Delirium,  not otherwise specified.   2. Schizoaffective disorder.   AXIS II: Deferred.   AXIS III: Chronic obstructive pulmonary disease. Please see the past medical history.   AXIS IV: General medical.   AXIS V: 25.   Ms. Barry DienesOwens likely has a multifactorial delirium.   She does not have the capacity for informed consent.   Regarding etiology, the typical side effect that Depakote can produce for a delirium etiology, ammonia, has been ruled out.   At her age and given ventriculomegaly any medications with potential anticholinergic effects could be involved in the etiology.   Clearly COPD can play a role. Her level of Depakote might be playing a role.   RECOMMENDATIONS:  1. Would complete the reversible CNS etiology work-up with checking RPR and folate.  2. Would first attempt the addition of oral Risperdal given that she has required a combination of Risperdal and Zyprexa but, most importantly, the Risperdal provides more potent antipsychotic effect and will allow a partial reduction in Zyprexa at this time; at this time the undersigned will reduce the Depakote to 500 mg daily in an effort to rule out the possibility of the Depakote sedation being a factor. Also, the undersigned will reduce the Zyprexa dosage to 10 mg daily adding Risperdal 2 mg daily.    3. Discussion regarding delirium in general and regardless of the etiology the duration can vary tremendously whether antipsychotic medication is utilized or not.  4. Please see the orders.   ____________________________ Adelene AmasJames S. Hansika Leaming, MD jsw:drc D: 06/19/2011 10:40:23 ET T: 06/19/2011 11:16:30 ET JOB#: [id number]  cc: Adelene AmasJames S. Maryana Pittmon, MD, <Dictator> Lester CarolinaJAMES S Rebbecca Osuna MD ELECTRONICALLY SIGNED 06/20/2011 1:47

## 2014-06-13 NOTE — Discharge Summary (Signed)
PATIENT NAME:  Tina Ellison, FINAN MR#:  [id number] DATE OF BIRTH:  30-Jan-1946  DATE OF ADMISSION:  06/19/2011 DATE OF DISCHARGE:  06/21/2011  DISCHARGE DIAGNOSES:  1. Altered mental status secondary to acute delirium, most likely secondary to psychotropic medications, now resolved.  2. Schizoaffective disorder.  3. Anxiety.  4. Chronic obstructive pulmonary disease, stable.  5. Gastroesophageal reflux disease.  6. Osteoporosis.  7. Previous history of bilateral hemiarthroplasty.   CONSULTS: Psychiatry, Dr. Jeanie Sewer.   HOSPITAL COURSE: This is a 69 year old female who lives at [facility]. She has an extensive psychiatric history with schizoaffective disorder, anxiety, and bipolar disorder. She also has a history of chronic obstructive pulmonary disease. She presented with altered mental status. She was having frequent falls. She was quite lethargic when she came in and drowsy, so she was admitted to medicine with altered mental status. She had extensive work-up done at admission. Her white count at admission was normal at 9.9, hemoglobin was stable at 13.9. Valproic acid was normal at 91. Urine drug screen was negative. Her creatinine was stable at 0.82. LFTs were normal. Her alcohol level less than 3. TSH is normal at 3.71. Ammonia level was normal at 29. Acetaminophen and salicylate levels were normal, though the acetaminophen level was on the low side. She had a CT of the head done that showed essentially no acute intracranial abnormality, chronic mild ventriculomegaly. Chest x-ray was negative. The patient was given some IV hydration. Psychiatry was consulted and they decreased her medications. During the hospital stay her divalproex was decreased to once a day and olanzapine was decreased to 5 mg once a day at bedtime. She was started on p.o. Risperdal. She had some paranoid thoughts during the hospital stay. Initially, it was planned for her to be transferred to a geropsychiatry unit in  [place], but the patient's interaction has improved, and as per guardian this is her baseline. She has some paranoid thoughts at baseline. Dr. Jeanie Sewer saw her today and cleared her for discharge to a group home because she has been at baseline. Her mental status has significantly improved. She is alert and oriented now. He suggested  to decrease the Zyprexa dose, so we have decreased the Zyprexa dose to 5 mg at bedtime because of lethargy at admission . We will continue the Depakote dose at 500 mg b.i.d. along with Risperdal injections.  She was also evaluated by physical therapy and they suggested Home Health physical therapy. Her urinalysis was negative.   DISCHARGE MEDICATIONS:  HOME MEDICATIONS:  1. Alendronate 70 mg once a week on Tuesday.  2. Omeprazole 20 mg daily.  3. Folic acid 1 mg daily.  4. Divalproex 500 mg twice a day extended-release. 5. ProAir q.12 hours as needed. 6. QPAP 500 mg 1-2 tabs every six hours p.r.n. for arthritic pain.  7. Aspirin 325 mg daily.  8. Risperdal Consta 25 mg intramuscular every two weeks.  9. Change olanzapine to 5 mg p.o. at bedtime and 5 mg p.o. b.i.d. as needed for agitation.  DIET: Mechanical soft diet.   CONDITION AT DISCHARGE: Comfortable. T-max 98.4, heart rate 84, blood pressure 107/74, saturating 92 to 93% on room air. Chest is clear but diminished. Abdomen soft, nontender. She is awake. She is alert. She is oriented to time, place, and person. Fewer paranoid thoughts today. Interaction is better. Her mood is quite upbeat today.   FOLLOWUP:  1. The patient should follow up with her primary psychiatrist, Dr. Charlynn Grimes, in one week.  2. Home Health with physical therapy.  3. Follow up with PMD at [facility] in 1 to 2 weeks, Dr. Jeananne RamaSchumacher.    TIME SPENT ON DISCHARGE: 60 minutes.  ____________________________ Fredia SorrowAbhinav Jaysin Gayler, MD ag:bjt D:  06/21/2011 15:10:05 ET          T: 06/21/2011 15:49:53 ET              JOB#:  [id number]  cc: [facility], Dr. Jeananne RamaSchumacher Dr. Charlynn GrimesMohammed Lateef, psychiatry  Fredia SorrowABHINAV Brentyn Seehafer MD ELECTRONICALLY SIGNED 06/30/2011 12:28

## 2014-06-13 NOTE — H&P (Signed)
PATIENT NAME:  Tina Ellison, Tina Ellison MR#:  [id number] DATE OF BIRTH:  05/07/1945  DATE OF ADMISSION:  06/18/2011  REFERRING PHYSICIAN: Dr. Lowella Fairy  CHIEF COMPLAINT: Altered mental status.   HISTORY OF PRESENT ILLNESS: The patient is a 69 year old Caucasian female with history of bipolar, schizoaffective disorder, chronic obstructive pulmonary disease, and multiple admissions at our psych institution here who lives at [facility] who was brought in for decreased mental status and falls. The patient currently is a poor historian, speaks unintelligible words.   History was obtained from the ER physician and chart. Apparently she was noted to be at her baseline on 04/26 but today had some difficulty getting up and was leaning to one side. She was lethargic and weak and had to be held up. EMS was called. Here she has had multiple labs, all of which are nondiagnostic. Electrolytes seem to be okay. Troponin is negative. TSH is within normal limits and urine drug screen is negative. CT of the head does not show an acute intracranial event. Hospitalist service was contacted. There is possible concern that she had overdosed on Depakote. However, the level here is not super therapeutic.   REVIEW OF SYSTEMS: Unobtainable given altered mental status.   PAST MEDICAL HISTORY:  1. History of chronic obstructive pulmonary disease.  2. Osteoporosis. 3. Gastroesophageal reflux disease. 4. Hypothyroidism, however her Synthroid has been discontinued.  5. Schizoaffective disorder.  6. Anxiety.  7. Bipolar.  8. History of thrombocytopenia.  9. Status post right hemiarthroplasty. 10. Left hemiarthroplasty. 11. Right breast implant.  12. Right mastectomy.    CURRENT MEDICATIONS:  1. Alendronate 70 mg weekly.  2. Aspirin 325 mg daily.  3. Divalproex  sodium 500 mg extended-release 2 times a day.  4. Folic acid 1 mg daily.  5. Olanzapine 10 mg in the morning and 15 mg at 8:00 p.m.  6. Omeprazole 20 mg  daily.  7. ProAir HFA 90 micrograms 1 puff every 12 hours as needed for shortness of breath.  8. Q-Pap Extra Strength 1 to 2 tabs every six hours as needed for arthritis.   9. Risperdal  25-mg injection every two weeks intramuscularly.  ALLERGIES: Per chart, no known allergies. However, from previous dictations she was noted to have allergies to Seroquel, haloperidol, and Clozaril.   SOCIAL HISTORY: Lives in a group home. She states she still smokes. No reported history of substance abuse, history of alcohol use as well.   FAMILY HISTORY: Unable to obtain.   PHYSICAL EXAMINATION:  VITAL SIGNS: Temperature on arrival 98.3, pulse rate was 99, respirations 16, blood pressure 143/82, oxygen saturation 99% on oxygen.   GENERAL: The patient is sleepy and lethargic but arousable, follows some commands.   HEENT: Normocephalic, atraumatic. Pupils are equal and reactive. Anicteric sclerae. Poor dentition. Dry mucous membranes.   NECK: Supple. No thyroid tenderness.   CARDIOVASCULAR: S1, S2. Regular rate and rhythm.  No murmurs, rubs, or gallops.   LUNGS: Clear to auscultation without wheezing or rhonchi.   ABDOMEN: Soft, nontender, nondistended. No guarding. Positive bowel sounds in all quadrants.   EXTREMITIES: No significant edema.   SKIN: No obvious rashes.   NEUROLOGIC: Cranial nerves II through XII appear to be grossly intact. Strength five out of five bilateral upper extremities, 4+/5 in the lower extremities.   PSYCHIATRIC: The patient is awake, alert, following commands. However speaks unintelligible words most of the time. Yawning and snoring through the interview process and sleepy.   LABORATORY, DIAGNOSTIC, AND RADIOLOGICAL DATA:  Glucose 105, BUN 20, creatinine 0.82, sodium 140, potassium 3.8, chloride 105, CO2 29, anion gap 6, ethanol level  undetectable, albumin 2.7, hemoglobin 13.9, hematocrit 42, platelets 119. WBC 9.9. Urine toxicology is negative. TSH 3.71, valproic acid  level is 91, AST 17, ALT 8. Urinalysis: No nitrites or leukocyte esterase. No blood or protein. Trace bacteria, 1 WBC, serum acetaminophen level was 3, serum salicylate level is negative. EKG: Normal sinus rhythm, rate 96. No acute ST elevations or depressions. X-ray of the chest, one view, showing no acute changes. CT of the head showing no acute intracranial abnormality. There is chronic mild ventriculomegaly.   ASSESSMENT AND PLAN: We have a  69 year old Caucasian female from a group home with history of bipolar, schizoaffective, chronic obstructive pulmonary disease, gastroesophageal reflux disease, anxiety, thrombocytopenia, and hypothyroidism who is here for altered mental status. The patient has nondiagnostic work-up thus far. Per chart the concern was she had divalproex overdose, yet the level is therapeutic. She has negative urine toxicology, urinalysis, and WBC. She has no fever and vitals seem to be okay. She is drowsy, but follows commands. She is lethargic. It is possible that there is another overdose. We will at this point obtain a psych consult for psychiatric evaluation and management. I will start the IV fluids. I will check  FT4. I will also do frequent neuro checks; however, I doubt this is a CVA. She had a negative CT of the head. Her electrolytes are fine and alcohol level is negative as well. We will monitor her mental state at this point. In regard to the chronic obstructive pulmonary disease, she does not appear to be wheezing. I will  place her on nebulizers as needed. I will resume her omeprazole. Although she has history of hypothyroidism, her thyroid replacement has been discontinued. The level here is 3.7 of TSH. I will check her FT4 level. I will start the patient on heparin for deep vein thrombosis prophylaxis and place a sitter for the patient.   CODE STATUS:  The patient is FULL CODE.     TOTAL TIME SPENT: 55 minutes.   ____________________________ Krystal EatonShayiq Imunique Samad,  MD sa:bjt D: 06/18/2011 17:00:58 ET T: 06/18/2011 17:49:24 ET JOB#: [id number]  cc: Krystal EatonShayiq Yulian Gosney, MD, <Dictator> Krystal EatonSHAYIQ Willona Phariss MD ELECTRONICALLY SIGNED 06/26/2011 12:09

## 2014-06-20 NOTE — Discharge Summary (Signed)
PATIENT NAME:  Tina Ellison, Tina Ellison MR#:  [id number] DATE OF BIRTH:  January 04, 1946  DATE OF ADMISSION:  04/06/2014 DATE OF DISCHARGE: 04/12/2014   DISCHARGE DIAGNOSES:  1.  Right hip fracture. Surgery was not done and suggested to her conservative management with low nonweightbearing exercise on the right side and follow-up in orthopedic.  2.  Schizophrenia.  3.  Urinary tract infection.  4.  Chronic obstructive pulmonary disease.   HISTORY OF PRESENTING ILLNESS: A 69 year old female who has schizoaffective disorder, chronic obstructive pulmonary disease, hypothyroidism and lives in a group home.  She came to the Emergency Room after having a fall at the facility and found having the right hip fracture. She was admitted to orthopedic floor. Orthopedic surgeon saw the patient and because of all of her mental issues suggested to just manage with conservation, which is pain medication, deep vein thrombosis prophylaxis, and nonweightbearing exercise on right lower extremity and follow-up in orthopedic clinic in 2 weeks. Other medical issues include schizophrenia. We called a psychiatric consult for that and they suggested to continue her medications. In the hospital she remained stable.   Urinary tract infection. The patient had some fever and we checked her urinalysis which was positive. She was started on antibiotic and we are discharging to finish the course of total of 7 days on oral therapy.   Chronic obstructive pulmonary disease. She remained stable in the hospital. No wheezing.   Hypothyroidism remained stable.  DISCHARGE MEDICATIONS: 1.  Divalproex sodium 500 mg 2 times a day.  2.  Risperdal 37.5 mg every 2 weeks.  3.  Centrum Silver 1 tablet once a day.  4.  Alendronate 70 mg oral once a week.  6.  Oxybutynin extended release 10 mg tablet once a day.  7.  Olanzapine 5 mg disintegrating tablet 2 times a day. 8.  Buspirone 10 mg oral tablet 2 times a day.  9.  Acetaminophen and oxycodone 325/5 mg  4 times a day as needed.  10.  Magnesium hydroxide 8% oral suspension 2 times a day as needed for constipation.  11.  Flomax 0.4 mg oral capsule once a day.  12.  Aspirin 325 mg oral once a day.  13.  Cephalexin zinc 250 mg oral tablet 4 times a day for 3 days.  14.  Docusate and Sona tablet for constipation 2 times a day.   DISCHARGE INSTRUCTIONS: Advised to have 3 liters nasal cannula, supplemental oxygen and low sodium diet. We are also arranging for psychiatric nurse and social worker to follow in the group home to manage her activities and to follow up with Orthopedic Clinic.   CONSULTATIONS WHILE INPATIENT: 1.  Dr. Derald MacleodJeffrey Poggi. 2.  Dr. Mordecai RasmussenJohn Clapacs.   LABORATORY DATA:  WBC count 9.5, hemoglobin 13.5, platelets 161,000 and MCV 89. Glucose 118, creatinine 0.7, sodium 140, potassium 4.0. INR is 1. CT scan of the head and cervical spine were done which does not show any acute abnormality. Right hip x-ray shows acute intertrochanteric fracture with proximal right femur surrounding bipolar arthroplasty. Urinalysis on admission was negative. WBC count was 10.6 and hemoglobin 10.6. On further follow up her urinalysis came positive with 1925 WBCs and 3+ leukocyte esterase but culture remained suggestive of contamination. We treated with Rocephin IV in hospital. On discharge hemoglobin is 9.2.   TOTAL TIME SPENT ON THIS DISCHARGE: 40 minutes.    ____________________________ Hope PigeonVaibhavkumar G. Elisabeth PigeonVachhani, MD vgv:mc D: 04/12/2014 15:36:22 ET T: 04/12/2014 16:07:12 ET JOB#: [id number]  cc: Heath GoldVaibhavkumar  Arrie Eastern, MD, <Dictator> Altamese Dilling MD ELECTRONICALLY SIGNED 05/03/2014 12:52
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
## 2014-06-20 NOTE — Consult Note (Signed)
Brief Consult Note: Diagnosis: schizoaffective disorder.   Patient was seen by consultant.   Consult note dictated.   Orders entered.   Comments: PSychiatry: PAtient seen and chart reviewed. Full note dictated. Adjusted meds by incresing depakote and buspar frequency and adding zyprexa 5mg  bid standing. Will follow.  Electronic Signatures: Reace Breshears, Jackquline DenmarkJohn T (MD)  (Signed 18-Feb-16 12:59)  Authored: Brief Consult Note   Last Updated: 18-Feb-16 12:59 by Audery Amellapacs, Malashia Kamaka T (MD)

## 2014-06-20 NOTE — Consult Note (Signed)
Brief Consult Note: Diagnosis: Type 1 peri-prosthetic fracture right hip.   Patient was seen by consultant.   Consult note dictated.   Recommend further assessment or treatment.   Orders entered.   Discussed with Attending MD.   Comments: Prosthesis appears stable distally, so recommend non-surgical treatment.  Patient needs to remain non-weightbearing for 6-8 weeks or until fracture heals.  Please continue pain medications as needed.  She will need rehab placement.  Please arrange follow-up appointment in my clinic in  4-5 weeks for repeat x-rays.  Electronic Signatures: Derald MacleodPoggi, Jeffrey (MD)  (Signed 16-Feb-16 18:43)  Authored: Brief Consult Note   Last Updated: 16-Feb-16 18:43 by Derald MacleodPoggi, Jeffrey (MD)

## 2014-06-20 NOTE — Consult Note (Signed)
Psychiatry: Psychiatry: Follow-up note for 69 year old woman with schizophrenia.  Patient seen and chart reviewed.  Patient appears to be more confused today.  She was agitated and making bizarre hostile statements towards me.  Did not seem to know exactly where she was.  Seemed quite sedated. continues to be sick with infection and status post fracture.  Vital signs have shown a spike in blood pressure and pulse and temperature over the last day which has come back down. the morning Zyprexa to relieve some of the sedation.  Increase nighttime Zyprexa to 15 mg.  Check Depakote level tomorrow.  We will continue to follow.  Electronic Signatures: Clapacs, Jackquline DenmarkJohn T (MD)  (Signed on 22-Feb-16 16:17)  Authored  Last Updated: 22-Feb-16 16:17 by Audery Amellapacs, John T (MD)
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
## 2014-06-20 NOTE — Consult Note (Signed)
Psychiatry: This evening found the patient in a different mental state. PAtient was staring and seemed preocupied. REsponded to speaking her name and spoke but was mostly muttered and seemed confused. Could not give more history. Was able to move all extremities. affect seemed frightened.  seems more psychotic and catatonic-like. I am going to order her risperdal dec shot even though it is 2-3 days early. Told patient and she agreed to the plan. Continue standing low dose zyprexa.  continue to follow.Schizophreiniato discuss with patient the importance of cooperation with physical therapy.  Electronic Signatures: Clapacs, Jackquline DenmarkJohn T (MD)  (Signed on 19-Feb-16 23:49)  Authored  Last Updated: 19-Feb-16 23:49 by Audery Amellapacs, John T (MD)
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
## 2014-07-07 ENCOUNTER — Encounter: Payer: Self-pay | Admitting: Emergency Medicine

## 2014-07-07 ENCOUNTER — Emergency Department: Payer: Medicare Other

## 2014-07-07 ENCOUNTER — Emergency Department
Admission: EM | Admit: 2014-07-07 | Discharge: 2014-07-08 | Disposition: A | Discharge status: Home / Self Care | Payer: Medicare Other | Attending: Emergency Medicine | Admitting: Emergency Medicine

## 2014-07-07 DIAGNOSIS — F0391 Unspecified dementia with behavioral disturbance: Secondary | ICD-10-CM | POA: Diagnosis not present

## 2014-07-07 DIAGNOSIS — Z720 Tobacco use: Secondary | ICD-10-CM | POA: Insufficient documentation

## 2014-07-07 DIAGNOSIS — F919 Conduct disorder, unspecified: Secondary | ICD-10-CM | POA: Diagnosis present

## 2014-07-07 DIAGNOSIS — E119 Type 2 diabetes mellitus without complications: Secondary | ICD-10-CM | POA: Diagnosis not present

## 2014-07-07 DIAGNOSIS — F209 Schizophrenia, unspecified: Secondary | ICD-10-CM | POA: Diagnosis not present

## 2014-07-07 DIAGNOSIS — R451 Restlessness and agitation: Secondary | ICD-10-CM

## 2014-07-07 DIAGNOSIS — Z79899 Other long term (current) drug therapy: Secondary | ICD-10-CM | POA: Diagnosis not present

## 2014-07-07 HISTORY — DX: Thrombocytopenia, unspecified: D69.6

## 2014-07-07 HISTORY — DX: Gastro-esophageal reflux disease without esophagitis: K21.9

## 2014-07-07 HISTORY — DX: Overactive bladder: N32.81

## 2014-07-07 HISTORY — DX: Weakness: R53.1

## 2014-07-07 HISTORY — DX: Hypothyroidism, unspecified: E03.9

## 2014-07-07 HISTORY — DX: Urinary tract infection, site not specified: N39.0

## 2014-07-07 HISTORY — DX: Age-related osteoporosis without current pathological fracture: M81.0

## 2014-07-07 HISTORY — DX: Vitamin deficiency, unspecified: E56.9

## 2014-07-07 HISTORY — DX: Schizoaffective disorder, unspecified: F25.9

## 2014-07-07 HISTORY — DX: Schizophrenia, unspecified: F20.9

## 2014-07-07 HISTORY — DX: Anxiety disorder, unspecified: F41.9

## 2014-07-07 HISTORY — DX: Chronic obstructive pulmonary disease, unspecified: J44.9

## 2014-07-07 HISTORY — DX: Retention of urine, unspecified: R33.9

## 2014-07-07 LAB — URINALYSIS COMPLETE WITH MICROSCOPIC (ARMC ONLY)
Bacteria, UA: NONE SEEN
Bilirubin Urine: NEGATIVE
Glucose, UA: NEGATIVE mg/dL
Hgb urine dipstick: NEGATIVE
Ketones, ur: NEGATIVE mg/dL
LEUKOCYTES UA: NEGATIVE
NITRITE: NEGATIVE
PROTEIN: NEGATIVE mg/dL
Specific Gravity, Urine: 1.005 (ref 1.005–1.030)
pH: 8 (ref 5.0–8.0)

## 2014-07-07 LAB — CBC WITH DIFFERENTIAL/PLATELET
Basophils Absolute: 0 10*3/uL (ref 0–0.1)
Basophils Relative: 0 %
EOS PCT: 1 %
Eosinophils Absolute: 0.1 10*3/uL (ref 0–0.7)
HEMATOCRIT: 38.1 % (ref 35.0–47.0)
HEMOGLOBIN: 13 g/dL (ref 12.0–16.0)
LYMPHS ABS: 2.3 10*3/uL (ref 1.0–3.6)
Lymphocytes Relative: 34 %
MCH: 28.5 pg (ref 26.0–34.0)
MCHC: 34 g/dL (ref 32.0–36.0)
MCV: 84.1 fL (ref 80.0–100.0)
MONO ABS: 0.8 10*3/uL (ref 0.2–0.9)
MONOS PCT: 11 %
NEUTROS ABS: 3.6 10*3/uL (ref 1.4–6.5)
Neutrophils Relative %: 54 %
Platelets: 167 10*3/uL (ref 150–440)
RBC: 4.54 MIL/uL (ref 3.80–5.20)
RDW: 14.9 % — AB (ref 11.5–14.5)
WBC: 6.7 10*3/uL (ref 3.6–11.0)

## 2014-07-07 LAB — ETHANOL: Alcohol, Ethyl (B): <5 mg/dL (ref <5)

## 2014-07-07 LAB — COMPREHENSIVE METABOLIC PANEL
ALBUMIN: 3.1 g/dL — AB (ref 3.5–5.0)
ALK PHOS: 100 U/L (ref 38–126)
ALT: 10 U/L — ABNORMAL LOW (ref 14–54)
ANION GAP: 7 (ref 5–15)
AST: 22 U/L (ref 15–41)
BILIRUBIN TOTAL: 0.2 mg/dL — AB (ref 0.3–1.2)
BUN: 11 mg/dL (ref 6–20)
CHLORIDE: 103 mmol/L (ref 101–111)
CO2: 28 mmol/L (ref 22–32)
CREATININE: 0.55 mg/dL (ref 0.44–1.00)
Calcium: 8.7 mg/dL — ABNORMAL LOW (ref 8.9–10.3)
GFR calc Af Amer: >60 mL/min (ref >60)
GLUCOSE: 162 mg/dL — AB (ref 65–99)
POTASSIUM: 4.3 mmol/L (ref 3.5–5.1)
Sodium: 138 mmol/L (ref 135–145)
Total Protein: 5.7 g/dL — ABNORMAL LOW (ref 6.5–8.1)

## 2014-07-07 MED ORDER — HALOPERIDOL LACTATE 5 MG/ML IJ SOLN
INTRAMUSCULAR | Status: AC
  Administered 2014-07-07: 5 mg via INTRAMUSCULAR
  Filled 2014-07-07: qty 1

## 2014-07-07 MED ORDER — LORAZEPAM 2 MG/ML IJ SOLN
INTRAMUSCULAR | Status: AC
  Filled 2014-07-07: qty 1

## 2014-07-07 MED ORDER — HALOPERIDOL LACTATE 5 MG/ML IJ SOLN
5.0000 mg | Freq: Once | INTRAMUSCULAR | Status: AC
  Administered 2014-07-07: 5 mg via INTRAMUSCULAR

## 2014-07-07 MED ORDER — LORAZEPAM 2 MG/ML IJ SOLN
1.0000 mg | Freq: Once | INTRAMUSCULAR | Status: AC
  Administered 2014-07-07: 1 mg via INTRAVENOUS

## 2014-07-07 MED ORDER — RISPERIDONE 1 MG PO TBDP
1.0000 mg | ORAL_TABLET | Freq: Three times a day (TID) | ORAL | Status: AC
  Administered 2014-07-08 (×2): 1 mg via ORAL
  Filled 2014-07-07: qty 1

## 2014-07-07 MED ORDER — DIVALPROEX SODIUM 500 MG PO DR TAB
500.0000 mg | DELAYED_RELEASE_TABLET | Freq: Three times a day (TID) | ORAL | Status: DC
  Administered 2014-07-08: 500 mg via ORAL

## 2014-07-07 MED ORDER — OXYBUTYNIN CHLORIDE 5 MG PO TABS
5.0000 mg | ORAL_TABLET | Freq: Two times a day (BID) | ORAL | Status: DC
  Administered 2014-07-08: 5 mg via ORAL
  Filled 2014-07-07 (×2): qty 1

## 2014-07-07 MED ORDER — LORAZEPAM 2 MG/ML IJ SOLN
INTRAMUSCULAR | Status: AC
  Administered 2014-07-07: 1 mg via INTRAVENOUS
  Filled 2014-07-07: qty 1

## 2014-07-07 NOTE — ED Notes (Signed)
ENVIRONMENTAL ASSESSMENT Potentially harmful objects out of patient reach: No.pt refused to give up belongings Personal belongings secured: No., pt refusing to give up belongings Patient dressed in hospital provided attire only: No., pt refused Plastic bags out of patient reach: No., pt refused Patient care equipment (cords, cables, call bells, lines, and drains) shortened, removed, or accounted for: Yes.   , shortened out of reach Equipment and supplies removed from bottom of stretcher: Yes.   Potentially toxic materials out of patient reach: Yes.   Sharps container removed or out of patient reach: Yes.
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
## 2014-07-07 NOTE — ED Notes (Signed)
Pt refused chest xray and blood work, MD aware

## 2014-07-07 NOTE — ED Notes (Signed)
BEHAVIORAL HEALTH ROUNDING Patient sleeping: No. Patient alert and oriented: no Behavior appropriate: No.; If no, describe: pt refusing any care, pt continues to have flight of ideas, calling everyone a witch Nutrition and fluids offered: No Toileting and hygiene offered: Yes  Sitter present: yes Law enforcement present: Yes

## 2014-07-07 NOTE — ED Notes (Signed)
Dr.Clapacs at bedside  

## 2014-07-07 NOTE — ED Notes (Signed)
Pt given food and drink.

## 2014-07-07 NOTE — ED Notes (Signed)
Bilateral lower extremity swelling and tightness, pt changed into scrub bottoms, pt refusing scrub top, pt sheets changed

## 2014-07-07 NOTE — ED Notes (Signed)
Pt presents to ED refusing all Nursing interventions. Pt states refuses to allow nursing staff to ask questions and get vital signs.

## 2014-07-07 NOTE — ED Notes (Signed)
When toileting offered, pt states "I dont like you, Go away"
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
## 2014-07-07 NOTE — Consult Note (Signed)
[facility] Face-to-Face Psychiatry Consult   Reason for Consult:  Consult for this 69 year old woman with schizophrenia sent from her living facility with reports that she has been aggressive Referring Physician:  Carollee Massed Patient Identification: Tina Ellison MRN:  [id number] Principal Diagnosis: Schizophrenia Diagnosis:   Patient Active Problem List   Diagnosis Date Noted  . Schizophrenia [F20.9] 07/07/2014  . Diabetes [E11.9] 07/07/2014    Total Time spent with patient: 45 minutes  Subjective:   Tina Ellison is a 69 y.o. female patient admitted with "lies" patient is not a very good historian. Center on commitment paperwork says she's been aggressive to peers.  HPI:  Information from the patient and the chart. Patient is not very forthcoming in her history. She tells me she is here because people at [facility] health care are lying about her. She says that everything they say is untrue and that they are only projecting on her. Patient denies that she has schizophrenia. She denies that she was aggressive or violent anyone. She won't give me much detail about recent history and won't discuss hallucinations. Says she is not having any suicidal thoughts. She does say that she is no longer taking Risperdal but I'm not sure how much to rely on that. We didn't get any list of medicines along with her.  Past psychiatric history last known psychiatric hospitalization 2009. History of schizophrenia. As are psychotic thinking and agitation.  Medical history: Patient is running a high blood sugar was blood sugar in her urine. Unclear if she has a diagnosis of diabetes. History of urinary incontinence.  Family history: She will not discuss it  Social history: Lives at [facility]. Unclear if she has a guardian.  Current medications: None are listed. Patient says she is taking Depakote ditch or pan and a stool softener HPI Elements:   Quality:  Agitated behavior. Severity:  Moderate. Timing:   Unknown but evidently fairly recent. Duration:  Intermittent. Context:  Possibly a change in medicine.  Past Medical History:  Past Medical History  Diagnosis Date  . Memory loss   . Anxiety   . Depression   . Weakness   . Schizoaffective disorder   . Osteoporosis   . Thrombocytopenia   . Vitamin deficiency   . GERD (gastroesophageal reflux disease)   . Urinary retention   . COPD (chronic obstructive pulmonary disease)   . Schizophrenia   . UTI (lower urinary tract infection)   . Hypothyroidism   . Anxiety disorder   . Overactive bladder     Past Surgical History  Procedure Laterality Date  . Breast surgery Right    Family History: History reviewed. No pertinent family history. Social History:  History  Alcohol Use No     History  Drug Use No    History   Social History  . Marital Status: Single    Spouse Name: N/A  . Number of Children: N/A  . Years of Education: N/A   Social History Main Topics  . Smoking status: Current Every Day Smoker -- 0.50 packs/day    Types: Cigarettes  . Smokeless tobacco: Never Used  . Alcohol Use: No  . Drug Use: No  . Sexual Activity: Not on file   Other Topics Concern  . None   Social History Narrative   Additional Social History:                          Allergies:  No Known Allergies  Labs: No results found for this or any previous visit (from the past 48 hour(s)).  Vitals: Blood pressure 109/87, pulse 115, temperature 98.7 F (37.1 C), temperature source Oral, resp. rate 20, SpO2 94 %.  Risk to Self: Is patient at risk for suicide?: No Risk to Others:   Prior Inpatient Therapy:   Prior Outpatient Therapy:    No current facility-administered medications for this encounter.   Current Outpatient Prescriptions  Medication Sig Dispense Refill  . alendronate (FOSAMAX) 70 MG tablet Take 70 mg by mouth once a week. Take with a full glass of water on an empty stomach.    . divalproex (DEPAKOTE ER) 500 MG  24 hr tablet Take by mouth 2 (two) times daily.    . folic acid (FOLVITE) 1 MG tablet Take 1 mg by mouth daily.    Marland Kitchen. levothyroxine (SYNTHROID, LEVOTHROID) 50 MCG tablet Take 50 mcg by mouth daily before breakfast.    . OLANZapine (ZYPREXA) 10 MG tablet Take 10 mg by mouth daily.    Marland Kitchen. OLANZapine (ZYPREXA) 15 MG tablet Take 15 mg by mouth at bedtime.    Marland Kitchen. omeprazole (PRILOSEC) 20 MG capsule Take 20 mg by mouth daily.    . risperiDONE (RISPERDAL) 0.25 MG tablet Take 0.25 mg by mouth at bedtime.    . risperiDONE microspheres (RISPERDAL CONSTA) 25 MG injection Inject 25 mg into the muscle every 14 (fourteen) days.      Musculoskeletal: Strength & Muscle Tone: flaccid Gait & Station: ataxic Patient leans: N/A  Psychiatric Specialty Exam: Physical Exam  Constitutional: She appears well-developed and well-nourished.  HENT:  Head: Normocephalic and atraumatic.  Eyes: Conjunctivae are normal. Pupils are equal, round, and reactive to light.  Neck: Normal range of motion.  Cardiovascular: Normal heart sounds.   Respiratory: Effort normal.  GI: Soft.  Musculoskeletal: Normal range of motion.  Neurological: She is alert.  Skin: Skin is warm and dry.  Psychiatric: Her mood appears anxious. Her affect is labile. Her speech is delayed. She is agitated. Thought content is paranoid and delusional. Cognition and memory are impaired. She expresses impulsivity. She is noncommunicative. She exhibits abnormal recent memory and abnormal remote memory.    Review of Systems  Constitutional: Negative.   HENT: Negative.   Eyes: Negative.   Respiratory: Negative.   Cardiovascular: Negative.   Gastrointestinal: Negative.   Musculoskeletal: Negative.   Skin: Negative.   Neurological: Negative.   Psychiatric/Behavioral: Positive for memory loss. Negative for depression, suicidal ideas and substance abuse. The patient is nervous/anxious and has insomnia.     Blood pressure 109/87, pulse 115, temperature 98.7  F (37.1 C), temperature source Oral, resp. rate 20, SpO2 94 %.There is no height or weight on file to calculate BMI.  General Appearance: Disheveled  Eye Contact::  Minimal  Speech:  Garbled  Volume:  Decreased  Mood:  Euphoric  Affect:  Labile  Thought Process:  Circumstantial and Tangential  Orientation:  NA  Thought Content:  Obsessions, Paranoid Ideation and Rumination  Suicidal Thoughts:  No  Homicidal Thoughts:  No  Memory:  Immediate;   Fair Recent;   Fair Remote;   Fair  Judgement:  Impaired  Insight:  Shallow  Psychomotor Activity:  Decreased  Concentration:  Poor  Recall:  Poor  Fund of Knowledge:Poor  Language: Good  Akathisia:  Negative  Handed:  Right  AIMS (if indicated):     Assets:  Housing  ADL's:  Impaired  Cognition: Impaired,  Moderate  Sleep:  Medical Decision Making: Review of Psycho-Social Stressors (1), Established Problem, Worsening (2), Review or order medicine tests (1) and Review of Medication Regimen & Side Effects (2)  Treatment Plan Summary: Plan Patient with a history of schizophrenia who apparently has been more agitated recently. Unclear what the cause of it is. Don't have enough records to demonstrate if there is been a change in her medicine. Currently on interview the patient is delusional paranoid and bizarre with labile mood. Has been uncooperative with nursing. Refusing to get blood drawn. Patient is clearly psychotic. Probably requires inpatient hospitalization. We will work on referral to geriatric psychiatry. Meanwhile restart Risperdal which I know it's been helpful for her in the past until we can get more data about her correct medicine.  Plan:  Recommend psychiatric Inpatient admission when medically cleared. Supportive therapy provided about ongoing stressors. Disposition: Recommend referral to geriatric psychiatry  Mordecai RasmussenCLAPACS, Azoria Abbett 07/07/2014 6:28 PM

## 2014-07-07 NOTE — ED Notes (Signed)
Pt is refusing to give up personal belongings or be changed out, MD aware Sheilyn Boehlke, Maryann Alarlivia S, RN

## 2014-07-07 NOTE — ED Notes (Signed)
BEHAVIORAL HEALTH ROUNDING Patient sleeping: No. Patient alert and oriented: no Behavior appropriate: No., pt continues to refuse any tx Nutrition and fluids offered: Yes  Toileting and hygiene offered: Yes  Sitter present: yes Law enforcement present: Yes

## 2014-07-07 NOTE — ED Notes (Signed)
BEHAVIORAL HEALTH ROUNDING Patient sleeping: No. Patient alert and oriented: no Behavior appropriate: No. pt refusing to give up any belongings Nutrition and fluids offered: Yes  Toileting and hygiene offered: Yes  Sitter present: yes Law enforcement present: Yes

## 2014-07-07 NOTE — ED Notes (Signed)
When asked if blood could be drawn, pt states "I am going tod raw your blood in the morgue, and not pay for your funeral, I am not part of your witchcraft", pt continues to clinch her fists together and ramble words, repeating RN name, pt refusing blood draws and vitals to be taken

## 2014-07-07 NOTE — ED Notes (Signed)
BEHAVIORAL HEALTH ROUNDING Patient sleeping: No. Patient alert and oriented: yes Behavior appropriate: No.; pt refusing chest xray, continues to ramble inappropriate words Nutrition and fluids offered: Yes  Toileting and hygiene offered: Yes  Sitter present: yes Law enforcement present: Yes

## 2014-07-08 ENCOUNTER — Emergency Department: Payer: Medicare Other

## 2014-07-08 ENCOUNTER — Other Ambulatory Visit: Payer: Self-pay

## 2014-07-08 DIAGNOSIS — F201 Disorganized schizophrenia: Secondary | ICD-10-CM | POA: Insufficient documentation

## 2014-07-08 DIAGNOSIS — F0391 Unspecified dementia with behavioral disturbance: Secondary | ICD-10-CM | POA: Diagnosis not present

## 2014-07-08 MED ORDER — RISPERIDONE 1 MG PO TABS
ORAL_TABLET | ORAL | Status: AC
  Filled 2014-07-08: qty 1

## 2014-07-08 MED ORDER — RISPERIDONE 1 MG PO TBDP
1.0000 mg | ORAL_TABLET | Freq: Three times a day (TID) | ORAL | Status: DC
  Administered 2014-07-08: 1 mg via ORAL
  Filled 2014-07-08 (×2): qty 1

## 2014-07-08 MED ORDER — DIVALPROEX SODIUM 500 MG PO DR TAB
DELAYED_RELEASE_TABLET | ORAL | Status: AC
  Administered 2014-07-08: 500 mg via ORAL
  Filled 2014-07-08: qty 1

## 2014-07-08 NOTE — ED Notes (Signed)
Pt assisted to toilet for void.  Pt refused evening snack

## 2014-07-08 NOTE — ED Notes (Signed)
Pt resting in bed with eyes closed, pt has blanket pulled over her face

## 2014-07-08 NOTE — ED Notes (Signed)
Patient observed sleeping at this time. No inappropriate behaviors exhibited since this RN took over care of patient in the 2300 hour. Patient sleeping with even and non-labored respirations. There are no anticipated needs at this time. Will continue to monitor.  

## 2014-07-08 NOTE — ED Notes (Signed)

## 2014-07-08 NOTE — ED Notes (Signed)
Food tray set at pt bedside

## 2014-07-08 NOTE — ED Notes (Signed)

## 2014-07-08 NOTE — ED Notes (Signed)
Patient sleeping. NAD noted. Will continue to monitor

## 2014-07-08 NOTE — ED Notes (Signed)
BEHAVIORAL HEALTH ROUNDING Patient sleeping: Yes.   Patient alert and oriented: not applicable Behavior appropriate: Yes.   Nutrition and fluids offered: No Toileting and hygiene offered: No Sitter present: yes Law enforcement present: Yes   Pt resting in bed with eyes closed and blanket over head

## 2014-07-08 NOTE — Consult Note (Signed)
  Psychiatry: Follow-up for this 69 year old woman with schizophrenia and diabetes and possible urinary tract infection. Patient interviewed in the emergency room. She has no new complaint. She says that she does not want to talk with me. Patient has not been aggressive although I have heard from nursing that she is sometimes lashing out.  On review of systems she is uncooperative but denies any medical complaints. Denies hallucinations denies suicidal ideation.  On mental status this is a disheveled and malodorous woman who looks her stated age or older. Uncooperative with the interview. Eye contact poor. Psychomotor activity slow. Speech is decreased in total amount affect is irritated. Denies suicidal or homicidal ideation. Judgment forearm cognitively slowed.  Continue current Risperdal and Depakote. Monitor vital signs. We are working on transfer to geriatric psychiatry when possible.

## 2014-07-08 NOTE — ED Notes (Signed)
BEHAVIORAL HEALTH ROUNDING Patient sleeping: Yes.   Patient alert and oriented: not applicable Behavior appropriate: Yes.  , pt sleeping Nutrition and fluids offered: No, pt sleeping Toileting and hygiene offered: No, pt sleeping Sitter present: yes Law enforcement present: Yes   ENVIRONMENTAL ASSESSMENT Potentially harmful objects out of patient reach: Yes.   Personal belongings secured: Yes.   Patient dressed in hospital provided attire only: Yes.   Plastic bags out of patient reach: Yes.   Patient care equipment (cords, cables, call bells, lines, and drains) shortened, removed, or accounted for: Yes.   Equipment and supplies removed from bottom of stretcher: Yes.   Potentially toxic materials out of patient reach: Yes.   Sharps container removed or out of patient reach: Yes.      Pt resting in bed with blankets over her face, respirations even and unlabored

## 2014-07-08 NOTE — ED Notes (Signed)
When attempting to preform an EKG, pt threw a cup of coffee on RN and NT, police officer and charge nurse brought to bedside, pt procceded to throw a cup of gingerale at staff, pt given verbal instructions to not throw drinks, pt states "You are a dog lover and I dont fornicate with people like that"

## 2014-07-08 NOTE — ED Notes (Signed)
Patient's VS updated. RN and tech to bedside. Patient angry. States, "You all are about to have plastic in your throats. Would you like that? I am about to do a tracheostomy on both of you."  RN attempted calming patient - minimal effects. Staff out of room and patient seems to calm when not stimulated. Patient in safe environment and not an immediate harm to herself or others at this time. No anticipated needs. Will continue to monitor.

## 2014-07-08 NOTE — ED Notes (Signed)
[place] sheriff's deputies at ED to pick up pt.

## 2014-07-08 NOTE — ED Notes (Signed)
Breakfast tray sat bedside by pt, pt resting in bed with eyes closed

## 2014-07-08 NOTE — ED Notes (Addendum)
ENVIRONMENTAL ASSESSMENT Potentially harmful objects out of patient reach: Yes.   Personal belongings secured: Yes.   Patient dressed in hospital provided attire only: Yes.   Plastic bags out of patient reach: Yes.   Patient care equipment (cords, cables, call bells, lines, and drains) shortened, removed, or accounted for: Yes.   Equipment and supplies removed from bottom of stretcher: Yes.   Potentially toxic materials out of patient reach: Yes.   Sharps container removed or out of patient reach: Yes.     BEHAVIORAL HEALTH ROUNDING Patient sleeping: No. Patient alert and oriented: yes Behavior appropriate: Yes.  ; If no, describe:  Nutrition and fluids offered: Yes  Toileting and hygiene offered: Yes  Sitter present: yes Law enforcement present: Yes    

## 2014-07-08 NOTE — ED Notes (Signed)
BEHAVIORAL HEALTH ROUNDING Patient sleeping: Yes.   Patient alert and oriented: no, pt continues to display aggressive behavior and verbal aggresion Behavior appropriate: No. Nutrition and fluids offered: No Toileting and hygiene offered: Yes  Sitter present: yes Law enforcement present: Yes

## 2014-07-08 NOTE — ED Notes (Signed)
Pt refused medication from 3 different RN's, MD notified

## 2014-07-08 NOTE — [facility] Counselor (Signed)
Pt. has been accepted to [facility]. Accepting physician is Dr. Lowanda FosterBeverly Jones. Call report to [phone]. Representative was Kelly ServicesKathleen. ER Staff Lynden Ang(Cathy, ER Sect.) have been made aware it.     Writer attempted to call Pt.'s Family/Support System. However, number listed for DSS in pt. Chart [phone]) is the office and goes to the after hour voicemail.  In the note written by Social Work note(Tara), pt. DSS Worker Lazaro Arms(Kay Flack) requested phone call and update but number for case worker wasn't listed. Called other listed Emergency  Contact Onalee Hua(David Humphries-[phone]) but Group Home staff states, the pt. Hasn't live there in over 3 months.  The [phone] number went to voicemail. No message was left.

## 2014-07-08 NOTE — ED Notes (Signed)
Chest x-ray preformed °

## 2014-07-08 NOTE — ED Notes (Addendum)
Patient awake at this time. Patient aggressive AEB yelling, cursing, and attempting to hit staff. Patient states, "I hope you all die. You bunch of sluts are thieves and liars. I am going to put you all in a microwave and burn all of your skin off slowly." RN attempted redirection - patient calmed. PO fluids provided. No further needs verbalized. Will continue to monitor.   BEHAVIORAL HEALTH ROUNDING Patient sleeping: NO Patient alert and oriented: Alert, but not oriented Behavior appropriate: No - see above note regarding aggression.  Describe behavior: Aggressive - yelling, curing, hitting.   Nutrition and fluids offered: YES Toileting and hygiene offered: YES Sitter present: Interior and spatial designerBehavioral tech rounding every 15 minutes on patient to ensure safety.  Law enforcement present: Loss adjuster, charteredYES Law enforcement agency: [employer]

## 2014-07-08 NOTE — Progress Notes (Signed)
Clinical Social Worker received call from [employer]Kay Flack with DSS for update on Pt. DSS is guardian. CSW updated DSS with plan based on chart review. Please notify DSS of any transfers from ED.   Wilford Gristara Ineze Serrao, LCSW [phone]
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
## 2014-07-08 NOTE — BHH Counselor (Signed)
Referral information for Geriatric Placement have been faxed to;   [facility](Jennifer -[phone]),   [facility] (Tabatha [phone]),   [facility]([phone]),   [facility](Kathleen-[phone]),   [facility] [phone]),   [facility] [phone]),   [facility]([phone]/[phone]).  [facility]([phone]),   [facility]([phone]),   No Beds and not taking referrals:  [facility] ([phone])

## 2014-07-08 NOTE — ED Notes (Signed)
BEHAVIORAL HEALTH ROUNDING  Patient sleeping: No. Patient alert and oriented: no, pt aggressive, talking to herself and yelling for a cat Behavior appropriate: No. Nutrition and fluids offered: Yes  Toileting and hygiene offered: Yes  Sitter present: yes Law enforcement present: Yes

## 2014-07-08 NOTE — ED Notes (Signed)
Pt took her medication, pt given food tray, see MAR

## 2014-07-08 NOTE — ED Notes (Signed)
RN attempted to administer MD ordered medications. Patient began yelling... "No no no. I don't know you people. You all are trying to kill me. You belong to [place]." RN had 2 other RNs attempt medication administration since 0600, however patient refuses. Starts off by looking at the floor and when staff turn to leave patient begins yelling explicatives. MD made aware.

## 2014-07-08 NOTE — ED Notes (Signed)
Patient continues to rest in room with no behaviors noted. Respirations even and non-labored. No anticipated needs identified. Will continue to monitor.  

## 2014-07-08 NOTE — ED Provider Notes (Signed)
-----------------------------------------   8:17 PM on 07/08/2014 -----------------------------------------   BP 118/73 mmHg  Pulse 107  Temp(Src) 98.7 F (37.1 C) (Oral)  Resp 16  SpO2 96%  The patient had no acute events since last update.  Calm and cooperative at this time.  Disposition is pending per Psychiatry/Behavioral Medicine team recommendations. [place] she has been calm since we took the chest x-ray    Arnaldo NatalPaul F Karma Ansley, MD 07/08/14 2017

## 2014-07-08 NOTE — ED Notes (Signed)
ED BHU PLACEMENT JUSTIFICATION Is the patient under IVC or is there intent for IVC: Yes.   Is the patient medically cleared: Yes.   Is there vacancy in the ED BHU: No. Is the population mix appropriate for patient: No. Is the patient awaiting placement in inpatient or outpatient setting: Yes.   Has the patient had a psychiatric consult: Yes.   Survey of unit performed for contraband, proper placement and condition of furniture, tampering with fixtures in bathroom, shower, and each patient room: Yes.  ; Findings:  APPEARANCE/BEHAVIOR calm and cooperative NEURO ASSESSMENT Orientation: person Hallucinations: No.None noted (Hallucinations) Speech: Normal and Pressured Gait: unsteady RESPIRATORY ASSESSMENT Normal expansion.  Clear to auscultation.  No rales, rhonchi, or wheezing. CARDIOVASCULAR ASSESSMENT regular rate and rhythm, S1, S2 normal, no murmur, click, rub or gallop GASTROINTESTINAL ASSESSMENT soft, nontender, BS WNL, no r/g EXTREMITIES normal strength, tone, and muscle mass, no deformities PLAN OF CARE Provide calm/safe environment. Vital signs assessed twice daily. ED BHU Assessment once each 12-hour shift. Collaborate with intake RN daily or as condition indicates. Assure the ED provider has rounded once each shift. Provide and encourage hygiene. Provide redirection as needed. Assess for escalating behavior; address immediately and inform ED provider.  Assess family dynamic and appropriateness for visitation as needed: Yes.  ; If necessary, describe findings:  Educate the patient/family about BHU procedures/visitation: Yes.  ; If necessary, describe findings:

## 2014-07-08 NOTE — BHH Counselor (Signed)
[facility]([phone]) is considering admitting pt., pending the results of her chest X-Ray. Writer informed pt. nurse in order to get a chest X-Ray.

## 2014-07-08 NOTE — ED Notes (Signed)
BEHAVIORAL HEALTH ROUNDING Patient sleeping: No. Patient alert and oriented: no, pt confused to time, place and situation Behavior appropriate: No., pt continues to be verbally agressive Nutrition and fluids offered: Yes  Toileting and hygiene offered: Yes  Sitter present: yes Law enforcement present: Yes

## 2014-07-08 NOTE — ED Notes (Signed)
Pt changed into new scrubs, pt bed soaking wet and bed changed, pt refused to let this RN help, charge RN misty assisted pt, pt states "I dont like your smells, I know who you are, you are a lesbian and you want me"  BEHAVIORAL HEALTH ROUNDING Patient sleeping: No. Patient alert and oriented: no, aggressive behavior  Behavior appropriate: No., aggressive behavior Nutrition and fluids offered: Yes  Toileting and hygiene offered: Yes  Sitter present: yes Law enforcement present: Yes

## 2014-07-08 NOTE — BHH Counselor (Signed)
[facility]. Waiting on call back with disposition.

## 2014-07-08 NOTE — ED Notes (Addendum)
Spoke with Dr. Dolores FrameSung regarding pending CXR order. Patient is sleeping and had to be given Haldol IM earlier secondary to extreme agitation. MD ok with order being placed on hold at this time. Radiology made aware. Per radiology, order will need to be cancelled and replaced when patient is ready and appropriate for the exam. Will pass along in report that CXR still needs to be done.

## 2014-07-08 NOTE — ED Notes (Signed)
BEHAVIORAL HEALTH ROUNDING Patient sleeping: No. Patient alert and oriented: no, pt unable to recall place, date, or situation Behavior appropriate: No.;  Pt conitnues to call security guard santa, and uncopperative with staff Nutrition and fluids offered: Yes , breakfast tray given Toileting and hygiene offered: Yes  Sitter present: yes Law enforcement present: Yes

## 2014-07-08 NOTE — ED Notes (Signed)
Pt states "Dont touch me with your veneral disease, you are voodoo and I hate you, put your hands up for the police"

## 2014-07-08 NOTE — ED Provider Notes (Signed)
-----------------------------------------   6:06 AM on 07/08/2014 -----------------------------------------   BP 121/52 mmHg  Pulse 112  Temp(Src) 98.7 F (37.1 C) (Oral)  Resp 20  SpO2 97%  Patient had periods of yelling at staff overnight. Did not require chemical sedation and was able to be verbally redirected.  Calm and cooperative at this time.  Disposition is pending per Psychiatry/Behavioral Medicine team recommendations.     Irean HongJade J Sung, MD 07/08/14 [phone]

## 2015-02-14 ENCOUNTER — Emergency Department: Payer: Medicare Other

## 2015-02-14 ENCOUNTER — Emergency Department
Admission: EM | Admit: 2015-02-14 | Discharge: 2015-02-15 | Disposition: A | Discharge status: Home / Self Care | Payer: Medicare Other | Attending: Emergency Medicine | Admitting: Emergency Medicine

## 2015-02-14 DIAGNOSIS — S0990XA Unspecified injury of head, initial encounter: Secondary | ICD-10-CM | POA: Diagnosis not present

## 2015-02-14 DIAGNOSIS — S80211A Abrasion, right knee, initial encounter: Secondary | ICD-10-CM | POA: Diagnosis not present

## 2015-02-14 DIAGNOSIS — S8001XA Contusion of right knee, initial encounter: Secondary | ICD-10-CM | POA: Diagnosis not present

## 2015-02-14 DIAGNOSIS — R000 Tachycardia, unspecified: Secondary | ICD-10-CM | POA: Diagnosis not present

## 2015-02-14 DIAGNOSIS — S199XXA Unspecified injury of neck, initial encounter: Secondary | ICD-10-CM | POA: Insufficient documentation

## 2015-02-14 DIAGNOSIS — F17210 Nicotine dependence, cigarettes, uncomplicated: Secondary | ICD-10-CM | POA: Insufficient documentation

## 2015-02-14 DIAGNOSIS — Y9389 Activity, other specified: Secondary | ICD-10-CM | POA: Insufficient documentation

## 2015-02-14 DIAGNOSIS — Y9289 Other specified places as the place of occurrence of the external cause: Secondary | ICD-10-CM | POA: Insufficient documentation

## 2015-02-14 DIAGNOSIS — W19XXXA Unspecified fall, initial encounter: Secondary | ICD-10-CM

## 2015-02-14 DIAGNOSIS — W01198A Fall on same level from slipping, tripping and stumbling with subsequent striking against other object, initial encounter: Secondary | ICD-10-CM | POA: Diagnosis not present

## 2015-02-14 DIAGNOSIS — E119 Type 2 diabetes mellitus without complications: Secondary | ICD-10-CM | POA: Insufficient documentation

## 2015-02-14 DIAGNOSIS — Y998 Other external cause status: Secondary | ICD-10-CM | POA: Insufficient documentation

## 2015-02-14 DIAGNOSIS — S0031XA Abrasion of nose, initial encounter: Secondary | ICD-10-CM | POA: Insufficient documentation

## 2015-02-14 DIAGNOSIS — Z79899 Other long term (current) drug therapy: Secondary | ICD-10-CM | POA: Insufficient documentation

## 2015-02-14 DIAGNOSIS — S8991XA Unspecified injury of right lower leg, initial encounter: Secondary | ICD-10-CM | POA: Diagnosis present

## 2015-02-14 LAB — CBC WITH DIFFERENTIAL/PLATELET
BASOS ABS: 0 10*3/uL (ref 0–0.1)
Basophils Relative: 0 %
Eosinophils Absolute: 0 10*3/uL (ref 0–0.7)
Eosinophils Relative: 0 %
HEMATOCRIT: 42.8 % (ref 35.0–47.0)
HEMOGLOBIN: 14.1 g/dL (ref 12.0–16.0)
LYMPHS ABS: 1.8 10*3/uL (ref 1.0–3.6)
LYMPHS PCT: 16 %
MCH: 28 pg (ref 26.0–34.0)
MCHC: 33 g/dL (ref 32.0–36.0)
MCV: 84.9 fL (ref 80.0–100.0)
Monocytes Absolute: 1.6 10*3/uL — ABNORMAL HIGH (ref 0.2–0.9)
Monocytes Relative: 15 %
NEUTROS ABS: 7.6 10*3/uL — AB (ref 1.4–6.5)
Neutrophils Relative %: 69 %
Platelets: 204 10*3/uL (ref 150–440)
RBC: 5.04 MIL/uL (ref 3.80–5.20)
RDW: 14.3 % (ref 11.5–14.5)
WBC: 11.1 10*3/uL — AB (ref 3.6–11.0)

## 2015-02-14 LAB — COMPREHENSIVE METABOLIC PANEL
ALK PHOS: 71 U/L (ref 38–126)
ALT: 28 U/L (ref 14–54)
AST: 102 U/L — AB (ref 15–41)
Albumin: 3.6 g/dL (ref 3.5–5.0)
Anion gap: 9 (ref 5–15)
BILIRUBIN TOTAL: 0.6 mg/dL (ref 0.3–1.2)
BUN: 25 mg/dL — AB (ref 6–20)
CALCIUM: 9.2 mg/dL (ref 8.9–10.3)
CHLORIDE: 102 mmol/L (ref 101–111)
CO2: 30 mmol/L (ref 22–32)
CREATININE: 0.79 mg/dL (ref 0.44–1.00)
GFR calc Af Amer: >60 mL/min (ref >60)
Glucose, Bld: 120 mg/dL — ABNORMAL HIGH (ref 65–99)
Potassium: 4.3 mmol/L (ref 3.5–5.1)
Sodium: 141 mmol/L (ref 135–145)
Total Protein: 7 g/dL (ref 6.5–8.1)

## 2015-02-14 NOTE — ED Notes (Signed)
RN in room to bring patient a drink, patient stated that she had decided to let us do blood work so that she can find out what is going on with her.   Blood work drawn and sent. MD informed.

## 2015-02-14 NOTE — ED Notes (Signed)
Attempted to call [facility] for transport for pt discharge with no answer.

## 2015-02-14 NOTE — ED Notes (Signed)
[facility] with [facility] and informed that patient is ready for discharge.  This nurse was told that no transportation would be available until in the morning and facility agreed to other means for transport.

## 2015-02-14 NOTE — ED Notes (Signed)
Patients O2 sats dropped to 89% on room air. Patient was placed on 2 L O2 via [place]. MD informed.

## 2015-02-14 NOTE — ED Provider Notes (Signed)
[facility] Emergency Department Provider Note  ____________________________________________  Time seen: Approximately 9:15 PM  I have reviewed the triage vital signs and the nursing notes.   HISTORY  Chief Complaint Fall  History is limited by chronic dementia  HPI Tina Ellison is a 69 y.o. female with an extensive chronic medical historypresents by EMS for evaluation after a fall.  She comes from the [facility].  Reportedly she became tangled and tripped up and fell, striking her face on the ground as well as her right knee.  She apparently originally told the paramedics that she did not strike her head, but she told me that she did and she does have an abrasion to the tip of her nose.  She also has multiple superficial abrasions to her right knee.  She is in no acute distress complaining of mild headache, neck pain, and pain in her right knee.  Her oxygen saturation dropped to 86% while sitting in the exam room but reportedly she is on oxygen at her nursing home and was not on oxygen while in the exam room.  She describes her pain as mild and says "but I am okay!".  She denies nausea/vomiting, chest pain, shortness of breath, abdominal pain, dysuria.   Past Medical History  Diagnosis Date  . Memory loss   . Anxiety   . Depression   . Weakness   . Schizoaffective disorder (HCC)   . Osteoporosis   . Thrombocytopenia (HCC)   . Vitamin deficiency   . GERD (gastroesophageal reflux disease)   . Urinary retention   . COPD (chronic obstructive pulmonary disease) (HCC)   . Schizophrenia (HCC)   . UTI (lower urinary tract infection)   . Hypothyroidism   . Anxiety disorder   . Overactive bladder     Patient Active Problem List   Diagnosis Date Noted  . Disorganized schizophrenia (HCC)   . Schizophrenia (HCC) 07/07/2014  . Diabetes (HCC) 07/07/2014    Past Surgical History  Procedure Laterality Date  . Breast surgery Right      Current Outpatient Rx  Name  Route  Sig  Dispense  Refill  . alendronate (FOSAMAX) 70 MG tablet   Oral   Take 70 mg by mouth once a week. Take with a full glass of water on an empty stomach. Every Tuesday         . divalproex (DEPAKOTE ER) 500 MG 24 hr tablet   Oral   Take by mouth 2 (two) times daily.         . folic acid (FOLVITE) 1 MG tablet   Oral   Take 1 mg by mouth daily.         Marland Kitchen levothyroxine (SYNTHROID, LEVOTHROID) 50 MCG tablet   Oral   Take 50 mcg by mouth daily before breakfast.         . OLANZapine (ZYPREXA) 10 MG tablet   Oral   Take 10 mg by mouth daily.         Marland Kitchen OLANZapine (ZYPREXA) 15 MG tablet   Oral   Take 15 mg by mouth at bedtime.         Marland Kitchen omeprazole (PRILOSEC) 20 MG capsule   Oral   Take 20 mg by mouth daily.         . risperiDONE (RISPERDAL) 0.25 MG tablet   Oral   Take 0.25 mg by mouth at bedtime.         . risperiDONE microspheres (  RISPERDAL CONSTA) 25 MG injection   Intramuscular   Inject 25 mg into the muscle every 14 (fourteen) days.           Allergies Review of patient's allergies indicates no known allergies.  No family history on file.  Social History Social History  Substance Use Topics  . Smoking status: Current Every Day Smoker -- 0.50 packs/day    Types: Cigarettes  . Smokeless tobacco: Never Used  . Alcohol Use: No    Review of Systems Unable to obtain reliable review of systems due to the patient's chronic dementia.  However she is currently complaining of headache, neck pain, and right knee pain.  ____________________________________________   PHYSICAL EXAM:  VITAL SIGNS: ED Triage Vitals  Enc Vitals Group     BP 02/14/15 2015 126/87 mmHg     Pulse Rate 02/14/15 2016 112     Resp 02/14/15 2015 18     Temp 02/14/15 2015 99.6 F (37.6 C)     Temp Source 02/14/15 2015 Oral     SpO2 02/14/15 2015 91 %     Weight 02/14/15 2015 150 lb (68.04 kg)     Height 02/14/15 2015  (1.676  m)     Head Cir --      Peak Flow --      Pain Score --      Pain Loc --      Pain Edu? --      Excl. in GC? --     Constitutional: Alert, generally well appearing in no acute distress.  However she appears significantly older than her chronological age would indicate. Eyes: Conjunctivae are normal. PERRL. EOMI. Head: Mild abrasion to the tip of her nose with no ecchymosis or tenderness to palpation of her nose or face.  No Battle sign or raccoon eyes. Nose: No congestion/rhinnorhea. Mouth/Throat: Mucous membranes are moist.  Oropharynx non-erythematous. Neck: No stridor.  No cervical spine tenderness to palpation. Cardiovascular: Tachycardia, regular rhythm. Grossly normal heart sounds.  Good peripheral circulation. Respiratory: Normal respiratory effort.  No retractions. Lungs CTAB. Gastrointestinal: Soft and nontender. No distention. No abdominal bruits. No CVA tenderness. Musculoskeletal: No lower extremity tenderness nor edema.  No joint effusions. Neurologic:  Normal speech and language. No gross focal neurologic deficits are appreciated.  Skin:  Skin is warm, dry and intact. No rash noted. Psychiatric: Mood and affect are normal. Speech and behavior are normal.  ____________________________________________   LABS (all labs ordered are listed, but only abnormal results are displayed)  Labs Reviewed  CBC WITH DIFFERENTIAL/PLATELET - Abnormal; Notable for the following:    WBC 11.1 (*)    Neutro Abs 7.6 (*)    Monocytes Absolute 1.6 (*)    All other components within normal limits  COMPREHENSIVE METABOLIC PANEL - Abnormal; Notable for the following:    Glucose, Bld 120 (*)    BUN 25 (*)    AST 102 (*)    All other components within normal limits   ____________________________________________  EKG  ED ECG REPORT I, Raun Routh, the attending physician, personally viewed and interpreted this ECG.   Date: 02/14/2015  EKG Time: 20:24   Rate: 109  Rhythm: sinus  tachycardia  Axis: Normal  Intervals:Normal  ST&T Change: No evidence of acute ischemia  ____________________________________________  RADIOLOGY   Dg Chest 2 View  02/14/2015  CLINICAL DATA:  Mechanical fall, no head injury. Oxygen desaturation. History COPD. EXAM: CHEST  2 VIEW COMPARISON:  Chest radiograph Jul 08, 2014  FINDINGS: Cardiomediastinal silhouette is normal. The lungs are clear without pleural effusions or focal consolidations. Trachea projects midline and there is no pneumothorax. Soft tissue planes and included osseous structures are non-suspicious. Surgical clips project in RIGHT chest wall. Surgical clips project at RIGHT pulmonary hilum. IMPRESSION: No active cardiopulmonary disease. Electronically Signed   By: Awilda Metro M.D.   On: 02/14/2015 22:00   Dg Knee 2 Views Right  02/14/2015  CLINICAL DATA:  69 year old female with right knee pain after falling earlier today EXAM: RIGHT KNEE - 1-2 VIEW COMPARISON:  None. FINDINGS: There is no evidence of fracture, dislocation, or joint effusion. There is no evidence of arthropathy or other focal bone abnormality. Soft tissues are unremarkable. IMPRESSION: Negative. Electronically Signed   By: Malachy Moan M.D.   On: 02/14/2015 20:55   Ct Head Wo Contrast  02/14/2015  CLINICAL DATA:  Status post fall. Concern for head or cervical spine injury. Initial encounter. EXAM: CT HEAD WITHOUT CONTRAST CT CERVICAL SPINE WITHOUT CONTRAST TECHNIQUE: Multidetector CT imaging of the head and cervical spine was performed following the standard protocol without intravenous contrast. Multiplanar CT image reconstructions of the cervical spine were also generated. COMPARISON:  CT of the head and cervical spine performed 04/06/2014 FINDINGS: CT HEAD FINDINGS There is no evidence of acute infarction, mass lesion, or intra- or extra-axial hemorrhage on CT. Prominence of the ventricles and sulci reflects moderate cortical volume loss. Mild  cerebellar atrophy is noted. Scattered periventricular and subcortical white matter change likely reflects small vessel ischemic microangiopathy. The brainstem and fourth ventricle are within normal limits. The basal ganglia are unremarkable in appearance. The cerebral hemispheres demonstrate grossly normal gray-white differentiation. No mass effect or midline shift is seen. There is no evidence of fracture; visualized osseous structures are unremarkable in appearance. The orbits are within normal limits. The paranasal sinuses and mastoid air cells are well-aerated. No significant soft tissue abnormalities are seen. CT CERVICAL SPINE FINDINGS There is no evidence of fracture or subluxation. Vertebral bodies demonstrate normal height and alignment. Intervertebral disc spaces are preserved. Mild facet disease is noted at the mid cervical spine. Prevertebral soft tissues are within normal limits. The visualized neural foramina are grossly unremarkable. The thyroid gland is unremarkable in appearance. The visualized lung apices are clear. No significant soft tissue abnormalities are seen. IMPRESSION: 1. No evidence of traumatic intracranial injury or fracture. 2. No evidence of fracture or subluxation along the cervical spine. 3. Moderate cortical volume loss and scattered small vessel ischemic microangiopathy. Electronically Signed   By: Roanna Raider M.D.   On: 02/14/2015 22:17   Ct Cervical Spine Wo Contrast  02/14/2015  CLINICAL DATA:  Status post fall. Concern for head or cervical spine injury. Initial encounter. EXAM: CT HEAD WITHOUT CONTRAST CT CERVICAL SPINE WITHOUT CONTRAST TECHNIQUE: Multidetector CT imaging of the head and cervical spine was performed following the standard protocol without intravenous contrast. Multiplanar CT image reconstructions of the cervical spine were also generated. COMPARISON:  CT of the head and cervical spine performed 04/06/2014 FINDINGS: CT HEAD FINDINGS There is no evidence  of acute infarction, mass lesion, or intra- or extra-axial hemorrhage on CT. Prominence of the ventricles and sulci reflects moderate cortical volume loss. Mild cerebellar atrophy is noted. Scattered periventricular and subcortical white matter change likely reflects small vessel ischemic microangiopathy. The brainstem and fourth ventricle are within normal limits. The basal ganglia are unremarkable in appearance. The cerebral hemispheres demonstrate grossly normal gray-white differentiation. No mass effect or midline  shift is seen. There is no evidence of fracture; visualized osseous structures are unremarkable in appearance. The orbits are within normal limits. The paranasal sinuses and mastoid air cells are well-aerated. No significant soft tissue abnormalities are seen. CT CERVICAL SPINE FINDINGS There is no evidence of fracture or subluxation. Vertebral bodies demonstrate normal height and alignment. Intervertebral disc spaces are preserved. Mild facet disease is noted at the mid cervical spine. Prevertebral soft tissues are within normal limits. The visualized neural foramina are grossly unremarkable. The thyroid gland is unremarkable in appearance. The visualized lung apices are clear. No significant soft tissue abnormalities are seen. IMPRESSION: 1. No evidence of traumatic intracranial injury or fracture. 2. No evidence of fracture or subluxation along the cervical spine. 3. Moderate cortical volume loss and scattered small vessel ischemic microangiopathy. Electronically Signed   By: Roanna RaiderJeffery  Chang M.D.   On: 02/14/2015 22:17    ____________________________________________   PROCEDURES  Procedure(s) performed: None  Critical Care performed: No ____________________________________________   INITIAL IMPRESSION / ASSESSMENT AND PLAN / ED COURSE  Pertinent labs & imaging results that were available during my care of the patient were reviewed by me and considered in my medical decision making  (see chart for details).  Initially I was going to only evaluate the patient for injuries from her fall.  Her knee radiographs are unremarkable.  However, the patient is 110s (though this may have been due to some initial agitation) and has a low grade temperature of 99.6.  There are mixed reports about whether she is actually on oxygen at baseline.  Given the mixed story and abnormal vital signs, I will further evaluate with a chest x-ray and basic labs and attempt to rule out an infectious process.  Additionally, the patient is complaining of a headache and neck pain, so I will rule out acute injury with a head CT and cervical spine CT.  ----------------------------------------- 11:34 PM on 02/14/2015 -----------------------------------------  The patient is fast asleep and somewhat sonorous at this time but her oxygen saturation is 92% with a good waveform.  RN verified that she is not on oxygen at baseline.  Her chest x-ray is unremarkable and I do not believe she has an acute respiratory issue.  Her pulse is in the 90s at this time.  Her labs are unremarkable.  She has not been able to produce a urine sample for us and I do not believe that it is in her best interest to perform an I and O catheterization given that she is asymptomatic; I believe that this would be traumatic for her with little benefit.  I will appear her for discharge back to her facility for outpatient follow-up.  I have not found any acute injuries as a result of her fall.  ____________________________________________  FINAL CLINICAL IMPRESSION(S) / ED DIAGNOSES  Final diagnoses:  Knee contusion, right, initial encounter  Knee abrasion, right, initial encounter  Fall, initial encounter  Abrasion of nose, initial encounter      NEW MEDICATIONS STARTED DURING THIS VISIT:  New Prescriptions   No medications on file     Loleta Roseory Blanka Rockholt, MD 02/14/15 [id number]

## 2015-02-14 NOTE — ED Notes (Signed)
Spoke with Tia from [facility], she states that patient was recently transferred back to their on December 13th. Patient was recently in SNF for broken hip and hip surgery. Tia states that the patient is not on oxygen at their facility.

## 2015-02-14 NOTE — ED Notes (Signed)
Patient informed that I needed to get blood work. Patient stated that it was against her religious up bringing for her to allow people to draw her blood. Explained to patient that we needed blood work to try to find out what was going on with her. Patient stated "my blood is my life."  Dr. York CeriseForbach informed.

## 2015-02-14 NOTE — Discharge Instructions (Signed)
You have been seen in the Emergency Department (ED) today for a fall.  Your fall work up (which included a head CT, cervical spine CT, and knee x-rays) does not show any concerning injuries.  Please take over-the-counter ibuprofen and/or Tylenol as needed for your pain (unless you have an allergy or your doctor as told you not to take them), or take any prescribed medication as instructed.  Keep your knee abrasions clean and dry.  We performed blood work and obtained a chest x-ray, and all the results were reassuring with no sign of acute infection/injury.  Please follow up with your doctor regarding today's Emergency Department (ED) visit and your recent fall.    Return to the ED if you have any headache, confusion, slurred speech, weakness/numbness of any arm or leg, or any increased pain.   Contusion A contusion is a deep bruise. Contusions are the result of a blunt injury to tissues and muscle fibers under the skin. The injury causes bleeding under the skin. The skin overlying the contusion may turn blue, purple, or yellow. Minor injuries will give you a painless contusion, but more severe contusions may stay painful and swollen for a few weeks.  CAUSES  This condition is usually caused by a blow, trauma, or direct force to an area of the body. SYMPTOMS  Symptoms of this condition include:  Swelling of the injured area.  Pain and tenderness in the injured area.  Discoloration. The area may have redness and then turn blue, purple, or yellow. DIAGNOSIS  This condition is diagnosed based on a physical exam and medical history. An X-ray, CT scan, or MRI may be needed to determine if there are any associated injuries, such as broken bones (fractures). TREATMENT  Specific treatment for this condition depends on what area of the body was injured. In general, the best treatment for a contusion is resting, icing, applying pressure to (compression), and elevating the injured area. This is often  called the RICE strategy. Over-the-counter anti-inflammatory medicines may also be recommended for pain control.  HOME CARE INSTRUCTIONS   Rest the injured area.  If directed, apply ice to the injured area:  Put ice in a plastic bag.  Place a towel between your skin and the bag.  Leave the ice on for 20 minutes, 2-3 times per day.  If directed, apply light compression to the injured area using an elastic bandage. Make sure the bandage is not wrapped too tightly. Remove and reapply the bandage as directed by your health care provider.  If possible, raise (elevate) the injured area above the level of your heart while you are sitting or lying down.  Take over-the-counter and prescription medicines only as told by your health care provider. SEEK MEDICAL CARE IF:  Your symptoms do not improve after several days of treatment.  Your symptoms get worse.  You have difficulty moving the injured area. SEEK IMMEDIATE MEDICAL CARE IF:   You have severe pain.  You have numbness in a hand or foot.  Your hand or foot turns pale or cold.   This information is not intended to replace advice given to you by your health care provider. Make sure you discuss any questions you have with your health care provider.   Document Released: 11/15/2004 Document Revised: 10/27/2014 Document Reviewed: 06/23/2014 Elsevier Interactive Patient Education 2016 Elsevier Inc.  Abrasion An abrasion is a cut or scrape on the outer surface of your skin. An abrasion does not extend through all of the  layers of your skin. It is important to care for your abrasion properly to prevent infection. CAUSES Most abrasions are caused by falling on or gliding across the ground or another surface. When your skin rubs on something, the outer and inner layer of skin rubs off.  SYMPTOMS A cut or scrape is the main symptom of this condition. The scrape may be bleeding, or it may appear red or pink. If there was an associated fall,  there may be an underlying bruise. DIAGNOSIS An abrasion is diagnosed with a physical exam. TREATMENT Treatment for this condition depends on how large and deep the abrasion is. Usually, your abrasion will be cleaned with water and mild soap. This removes any dirt or debris that may be stuck. An antibiotic ointment may be applied to the abrasion to help prevent infection. A bandage (dressing) may be placed on the abrasion to keep it clean. You may also need a tetanus shot. HOME CARE INSTRUCTIONS Medicines  Take or apply medicines only as directed by your health care provider.  If you were prescribed an antibiotic ointment, finish all of it even if you start to feel better. Wound Care  Clean the wound with mild soap and water 2-3 times per day or as directed by your health care provider. Pat your wound dry with a clean towel. Do not rub it.  There are many different ways to close and cover a wound. Follow instructions from your health care provider about:  Wound care.  Dressing changes and removal.  Check your wound every day for signs of infection. Watch for:  Redness, swelling, or pain.  Fluid, blood, or pus. General Instructions  Keep the dressing dry as directed by your health care provider. Do not take baths, swim, use a hot tub, or do anything that would put your wound underwater until your health care provider approves.  If there is swelling, raise (elevate) the injured area above the level of your heart while you are sitting or lying down.  Keep all follow-up visits as directed by your health care provider. This is important. SEEK MEDICAL CARE IF:  You received a tetanus shot and you have swelling, severe pain, redness, or bleeding at the injection site.  Your pain is not controlled with medicine.  You have increased redness, swelling, or pain at the site of your wound. SEEK IMMEDIATE MEDICAL CARE IF:  You have a red streak going away from your wound.  You have a  fever.  You have fluid, blood, or pus coming from your wound.  You notice a bad smell coming from your wound or your dressing.   This information is not intended to replace advice given to you by your health care provider. Make sure you discuss any questions you have with your health care provider.   Document Released: 11/15/2004 Document Revised: 10/27/2014 Document Reviewed: 02/03/2014 Elsevier Interactive Patient Education [employer]2016 Elsevier Inc[employer]

## 2015-02-14 NOTE — ED Notes (Signed)
Pt arrives via EMS c/o fall PTA. Pt from [facility]. Pt states that fall was a mechanical fall, denies hitting head. Pt only c/o right knee abrasion. Pt in NAD, RR even and unlabored.

## 2015-05-20 ENCOUNTER — Emergency Department (HOSPITAL_COMMUNITY): Payer: Medicare Other

## 2015-05-20 ENCOUNTER — Emergency Department (HOSPITAL_COMMUNITY)
Admission: EM | Admit: 2015-05-20 | Discharge: 2015-05-20 | Disposition: A | Discharge status: Home / Self Care | Payer: Medicare Other | Attending: Emergency Medicine | Admitting: Emergency Medicine

## 2015-05-20 ENCOUNTER — Encounter (HOSPITAL_COMMUNITY): Payer: Self-pay | Admitting: Emergency Medicine

## 2015-05-20 DIAGNOSIS — Y929 Unspecified place or not applicable: Secondary | ICD-10-CM | POA: Diagnosis not present

## 2015-05-20 DIAGNOSIS — J449 Chronic obstructive pulmonary disease, unspecified: Secondary | ICD-10-CM | POA: Diagnosis not present

## 2015-05-20 DIAGNOSIS — Y999 Unspecified external cause status: Secondary | ICD-10-CM | POA: Insufficient documentation

## 2015-05-20 DIAGNOSIS — F329 Major depressive disorder, single episode, unspecified: Secondary | ICD-10-CM | POA: Insufficient documentation

## 2015-05-20 DIAGNOSIS — S99922A Unspecified injury of left foot, initial encounter: Secondary | ICD-10-CM | POA: Insufficient documentation

## 2015-05-20 DIAGNOSIS — W010XXA Fall on same level from slipping, tripping and stumbling without subsequent striking against object, initial encounter: Secondary | ICD-10-CM | POA: Diagnosis not present

## 2015-05-20 DIAGNOSIS — F209 Schizophrenia, unspecified: Secondary | ICD-10-CM | POA: Diagnosis not present

## 2015-05-20 DIAGNOSIS — R52 Pain, unspecified: Secondary | ICD-10-CM

## 2015-05-20 DIAGNOSIS — S92902A Unspecified fracture of left foot, initial encounter for closed fracture: Secondary | ICD-10-CM

## 2015-05-20 DIAGNOSIS — F17210 Nicotine dependence, cigarettes, uncomplicated: Secondary | ICD-10-CM | POA: Diagnosis not present

## 2015-05-20 DIAGNOSIS — Y9301 Activity, walking, marching and hiking: Secondary | ICD-10-CM | POA: Diagnosis not present

## 2015-05-20 MED ORDER — HYDROCODONE-ACETAMINOPHEN 5-325 MG PO TABS: 2.0000 | ORAL_TABLET | ORAL | Status: DC | PRN

## 2015-05-20 MED ORDER — LABETALOL HCL 5 MG/ML IV SOLN: 20.0000 mg | Freq: Once | INTRAVENOUS | Status: DC

## 2015-05-20 MED ORDER — LORAZEPAM 1 MG PO TABS
1.0000 mg | ORAL_TABLET | Freq: Once | ORAL | Status: AC
  Administered 2015-05-20: 1 mg via ORAL
  Filled 2015-05-20: qty 1

## 2015-05-20 MED ORDER — HYDROCODONE-ACETAMINOPHEN 5-325 MG PO TABS
1.0000 | ORAL_TABLET | Freq: Once | ORAL | Status: AC
  Administered 2015-05-20: 1 via ORAL
  Filled 2015-05-20: qty 1

## 2015-05-20 NOTE — ED Notes (Signed)
States she tripped and fell at noon today but continues to have left ankle/foot pain. Was ambulatory on scene.

## 2015-05-20 NOTE — Discharge Instructions (Signed)
Wear the walking boot, whenever you are out.  It will help to remove the boot, and apply ice to the sore areas 3 or 4 times a day for 30 minutes.  When you walk, always use your walker, and get help.  We are ordering physical therapy, to help assist you with your walker and control of the pain from the fractures.  It will likely take 3 or 4 weeks for the fractures to heal.

## 2015-05-20 NOTE — ED Notes (Signed)
Pt able to stand with minimal assist using walker-like device. Had some difficulty with ambulating.   Spoke with Larkin InaErica Jones (Administrative Sup) at facility. States pt was walking with her walker when her wheel got stuck on something causing her to fall. Also says they do have aides that can help her with ambulation. Per Ms Yetta BarreJones, recommended a PT order for eval. DC instructions reviewed with Ms. Yetta BarreJones.

## 2015-05-20 NOTE — ED Provider Notes (Signed)
CSN: [id number]     Arrival date & time 05/20/15  1741 History   First MD Initiated Contact with Patient 05/20/15 1757     Chief Complaint  Patient presents with  . Ankle Injury     (Consider location/radiation/quality/duration/timing/severity/associated sxs/prior Treatment) HPI   Tina Ellison is a 70 y.o. female who states that she fell earlier today, when she tripped. She is vague about exactly what happened. She states that she asked for help, but the staff at the group home, would not help her. She had increasing pain in the left foot, and finally they decided to bring her here. She denies headache, neck pain, back pain, nausea, vomiting, fever or chills. There were no other injuries in the fall. There are no other known modifying factors.   Past Medical History  Diagnosis Date  . Memory loss   . Anxiety   . Depression   . Weakness   . Schizoaffective disorder (HCC)   . Osteoporosis   . Thrombocytopenia (HCC)   . Vitamin deficiency   . GERD (gastroesophageal reflux disease)   . Urinary retention   . COPD (chronic obstructive pulmonary disease) (HCC)   . Schizophrenia (HCC)   . UTI (lower urinary tract infection)   . Hypothyroidism   . Anxiety disorder   . Overactive bladder    Past Surgical History  Procedure Laterality Date  . Breast surgery Right    History reviewed. No pertinent family history. Social History  Substance Use Topics  . Smoking status: Current Every Day Smoker -- 0.50 packs/day    Types: Cigarettes  . Smokeless tobacco: Never Used  . Alcohol Use: No   OB History    No data available     Review of Systems  All other systems reviewed and are negative.     Allergies  Review of patient's allergies indicates no known allergies.  Home Medications   Prior to Admission medications   Medication Sig Start Date End Date Taking? Authorizing Provider  alendronate (FOSAMAX) 70 MG tablet Take 70 mg by mouth once a week. Take with a full glass  of water on an empty stomach. Every Tuesday    Historical Provider, MD  divalproex (DEPAKOTE ER) 500 MG 24 hr tablet Take by mouth 2 (two) times daily.    Historical Provider, MD  folic acid (FOLVITE) 1 MG tablet Take 1 mg by mouth daily.    Historical Provider, MD  levothyroxine (SYNTHROID, LEVOTHROID) 50 MCG tablet Take 50 mcg by mouth daily before breakfast.    Historical Provider, MD  OLANZapine (ZYPREXA) 10 MG tablet Take 10 mg by mouth daily.    Historical Provider, MD  OLANZapine (ZYPREXA) 15 MG tablet Take 15 mg by mouth at bedtime.    Historical Provider, MD  omeprazole (PRILOSEC) 20 MG capsule Take 20 mg by mouth daily.    Historical Provider, MD  risperiDONE (RISPERDAL) 0.25 MG tablet Take 0.25 mg by mouth at bedtime.    Historical Provider, MD  risperiDONE microspheres (RISPERDAL CONSTA) 25 MG injection Inject 25 mg into the muscle every 14 (fourteen) days.    Historical Provider, MD   BP 165/121 mmHg  Pulse 94  Temp(Src) 98.2 F (36.8 C) (Oral)  Resp 18  Ht 5\' 7"  (1.702 m)  Wt 150 lb (68.04 kg)  BMI 23.49 kg/m2  SpO2 95% Physical Exam  Constitutional: She is oriented to person, place, and time. She appears well-developed. No distress.  Elderly, frail  HENT:  Head: Normocephalic and  atraumatic.  Right Ear: External ear normal.  Left Ear: External ear normal.  Eyes: Conjunctivae and EOM are normal. Pupils are equal, round, and reactive to light.  Neck: Normal range of motion and phonation normal. Neck supple.  Cardiovascular: Normal rate.   Pulmonary/Chest: Effort normal. She exhibits no bony tenderness.  Musculoskeletal:  Tender left foot, diffusely with slight swelling. No left ankle tenderness, swelling or deformity. Neurovascular intact distally in the toes of the left foot.  Neurological: She is alert and oriented to person, place, and time. No cranial nerve deficit or sensory deficit. She exhibits normal muscle tone. Coordination normal.  Skin: Skin is warm, dry and  intact.  Psychiatric: She has a normal mood and affect. Her behavior is normal.  Nursing note and vitals reviewed.   ED Course  Procedures (including critical care time)   Medications  HYDROcodone-acetaminophen (NORCO/VICODIN) 5-325 MG per tablet 1 tablet (1 tablet Oral Given 05/20/15 1838)  LORazepam (ATIVAN) tablet 1 mg (1 mg Oral Given 05/20/15 1838)    Patient Vitals for the past 24 hrs:  BP Temp Temp src Pulse Resp SpO2 Height Weight  05/20/15 1900 (!) 165/121 mmHg - - 94 18 95 % - -  05/20/15 1800 (!) 167/128 mmHg 98.2 F (36.8 C) Oral 100 20 94 %  (1.702 m) 150 lb (68.04 kg)   Ace wrap & CAM walker applied by nursing  Vitals with Age-Percentiles 05/20/2015 02/15/2015 02/14/2015  Length 170.2 cm    Systolic 167 110 161  Diastolic 128 67 73  MAP   86  Pulse 100 98   Respiration Weight 68.04 kg    BMI 23.5      8:41 PM Reevaluation with update and discussion. After initial assessment and treatment, an updated evaluation reveals She is comfortable now, she states her foot feels better wearing the walker boot. On a relation trial. She required help to stand and walk. She typically walks with a walker. Her facility was contacted and stated that the patient fell because her walker wheel got stuck in a chair, leading her to fall. She is in a facility that has 24 7 assistance where they can help her, ambulate.Mancel Bale L     Labs Review Labs Reviewed - No data to display  Imaging Review Dg Ankle Complete Left  05/20/2015  CLINICAL DATA:  Tripped and fell at noon today, continued LEFT foot and ankle pain EXAM: LEFT ANKLE COMPLETE - 3+ VIEW COMPARISON:  05/30/2004 FINDINGS: Obliquity on lateral view. Diffuse osseous demineralization. Mild narrowing of ankle joint. Probable nondisplaced fracture at base of fifth metatarsal. No additional fracture, dislocation, or bone destruction. IMPRESSION: Osseous demineralization without acute LEFT ankle abnormality.  Probable nondisplaced fracture at proximal LEFT fifth metatarsal. Electronically Signed   By: Ulyses Southward M.D.   On: 05/20/2015 18:42   Dg Foot Complete Left  05/20/2015  CLINICAL DATA:  70 year old female with acute left foot pain following fall today. Initial encounter. EXAM: LEFT FOOT - COMPLETE 3+ VIEW COMPARISON:  None. FINDINGS: Nondisplaced fractures of the proximal fourth and fifth metatarsals noted. Nondisplaced fractures of the second, third and fourth metatarsal neck/ head noted. There is no evidence of subluxation or dislocation. Diffuse osteopenia is. IMPRESSION: Nondisplaced fractures of the proximal fourth and fifth metatarsals and distal second, third and fourth metatarsals. No evidence of subluxation or dislocation. Diffuse osteopenia. Electronically Signed   By: Harmon Pier M.D.   On: 05/20/2015 18:43   I have personally  reviewed and evaluated these images and lab results as part of my medical decision-making.   EKG Interpretation None      MDM   Final diagnoses:  Pain  Foot fracture, left, closed, initial encounter    Fall, likely mechanical with fractures, left foot. Fractures are stable, and did not require surgical repair. Patient with incidental, hypertension, without clinical evidence for end organ damage. Recent blood pressures indicate normotension, therefore I suspect that her pressures elevated secondary to pain.  Nursing Notes Reviewed/ Care Coordinated Applicable Imaging Reviewed Interpretation of Laboratory Data incorporated into ED treatment  The patient appears reasonably screened and/or stabilized for discharge and I doubt any other medical condition or other [facility] requiring further screening, evaluation, or treatment in the ED at this time prior to discharge.  Plan: Home Medications- Norco; Home Treatments- rest, use walker and CAM boot, get help when ambulating. Home PT assessment ordered; return here if the recommended treatment, does not improve the  symptoms; Recommended follow up- orthopedic follow-up 1 or 2 weeks.     Mancel Bale, MD 05/23/15 [phone]

## 2015-05-30 ENCOUNTER — Emergency Department (HOSPITAL_COMMUNITY): Payer: Medicare Other

## 2015-05-30 ENCOUNTER — Encounter (HOSPITAL_COMMUNITY): Payer: Self-pay | Admitting: Emergency Medicine

## 2015-05-30 ENCOUNTER — Emergency Department (HOSPITAL_COMMUNITY)
Admission: EM | Admit: 2015-05-30 | Discharge: 2015-05-30 | Disposition: A | Discharge status: Home / Self Care | Payer: Medicare Other | Attending: Emergency Medicine | Admitting: Emergency Medicine

## 2015-05-30 DIAGNOSIS — Y999 Unspecified external cause status: Secondary | ICD-10-CM | POA: Insufficient documentation

## 2015-05-30 DIAGNOSIS — F329 Major depressive disorder, single episode, unspecified: Secondary | ICD-10-CM | POA: Diagnosis not present

## 2015-05-30 DIAGNOSIS — W19XXXA Unspecified fall, initial encounter: Secondary | ICD-10-CM

## 2015-05-30 DIAGNOSIS — S99922A Unspecified injury of left foot, initial encounter: Secondary | ICD-10-CM | POA: Diagnosis present

## 2015-05-30 DIAGNOSIS — M810 Age-related osteoporosis without current pathological fracture: Secondary | ICD-10-CM | POA: Diagnosis not present

## 2015-05-30 DIAGNOSIS — Y939 Activity, unspecified: Secondary | ICD-10-CM | POA: Diagnosis not present

## 2015-05-30 DIAGNOSIS — E039 Hypothyroidism, unspecified: Secondary | ICD-10-CM | POA: Diagnosis not present

## 2015-05-30 DIAGNOSIS — S92352D Displaced fracture of fifth metatarsal bone, left foot, subsequent encounter for fracture with routine healing: Secondary | ICD-10-CM | POA: Insufficient documentation

## 2015-05-30 DIAGNOSIS — S92302D Fracture of unspecified metatarsal bone(s), left foot, subsequent encounter for fracture with routine healing: Secondary | ICD-10-CM

## 2015-05-30 DIAGNOSIS — Z79899 Other long term (current) drug therapy: Secondary | ICD-10-CM | POA: Diagnosis not present

## 2015-05-30 DIAGNOSIS — J449 Chronic obstructive pulmonary disease, unspecified: Secondary | ICD-10-CM | POA: Insufficient documentation

## 2015-05-30 DIAGNOSIS — M79672 Pain in left foot: Secondary | ICD-10-CM

## 2015-05-30 DIAGNOSIS — F17210 Nicotine dependence, cigarettes, uncomplicated: Secondary | ICD-10-CM | POA: Insufficient documentation

## 2015-05-30 DIAGNOSIS — S92342D Displaced fracture of fourth metatarsal bone, left foot, subsequent encounter for fracture with routine healing: Secondary | ICD-10-CM | POA: Diagnosis not present

## 2015-05-30 DIAGNOSIS — W19XXXD Unspecified fall, subsequent encounter: Secondary | ICD-10-CM | POA: Insufficient documentation

## 2015-05-30 DIAGNOSIS — Y929 Unspecified place or not applicable: Secondary | ICD-10-CM | POA: Insufficient documentation

## 2015-05-30 DIAGNOSIS — F259 Schizoaffective disorder, unspecified: Secondary | ICD-10-CM | POA: Insufficient documentation

## 2015-05-30 DIAGNOSIS — S92332D Displaced fracture of third metatarsal bone, left foot, subsequent encounter for fracture with routine healing: Secondary | ICD-10-CM | POA: Diagnosis not present

## 2015-05-30 NOTE — ED Notes (Signed)
[age] yof presents from [facility] via EMS for evaluation of left foot. Pt was seen on 3/31 after trip and fall with left foot pain. Was dx with nondisplaced fx of 1-5th metatarsals. Was placed in boot and dc'd. Presents today with increase redness, pain and swelling to left foot. No fever

## 2015-05-30 NOTE — ED Notes (Signed)
Spoke with Alcario DroughtErica [phone] (facility supervisor). DC instructions reviewed. Aware of instructions to continue wearing boot, elevate LLE, ice and following up with her primary care doctor later this week. Pt up in chair, eating healthy choice meal.   She will be here in 15 mins to pick pt.

## 2015-05-30 NOTE — Discharge Instructions (Signed)
There is no change in the x-ray of the left foot. Continue to use your support boot. Elevate. Ice. Tylenol or ibuprofen for pain.

## 2015-05-30 NOTE — ED Provider Notes (Signed)
CSN: [id number]     Arrival date & time 05/30/15  1531 History   First MD Initiated Contact with Patient 05/30/15 1535     Chief Complaint  Patient presents with  . Foot Pain     (Consider location/radiation/quality/duration/timing/severity/associated sxs/prior Treatment) HPI..... Level V caveat for dementia and schizophrenia. Patient recently fractured multiple bones of her left foot after a fall on 05/20/2015. She apparently stumbled today and reinjured the same foot. No head or neck trauma.  Past Medical History  Diagnosis Date  . Memory loss   . Anxiety   . Depression   . Weakness   . Schizoaffective disorder (HCC)   . Osteoporosis   . Thrombocytopenia (HCC)   . Vitamin deficiency   . GERD (gastroesophageal reflux disease)   . Urinary retention   . COPD (chronic obstructive pulmonary disease) (HCC)   . Schizophrenia (HCC)   . UTI (lower urinary tract infection)   . Hypothyroidism   . Anxiety disorder   . Overactive bladder    Past Surgical History  Procedure Laterality Date  . Breast surgery Right    History reviewed. No pertinent family history. Social History  Substance Use Topics  . Smoking status: Current Every Day Smoker -- 0.50 packs/day    Types: Cigarettes  . Smokeless tobacco: Never Used  . Alcohol Use: No   OB History    No data available     Review of Systems  All other systems reviewed and are negative.     Allergies  Haldol  Home Medications   Prior to Admission medications   Medication Sig Start Date End Date Taking? Authorizing Provider  alendronate (FOSAMAX) 70 MG tablet Take 70 mg by mouth once a week. Take with a full glass of water on an empty stomach. Every Tuesday   Yes Historical Provider, MD  atorvastatin (LIPITOR) 10 MG tablet Take 10 mg by mouth every evening.   Yes Historical Provider, MD  benztropine (COGENTIN) 1 MG tablet Take 1 mg by mouth 2 (two) times daily.   Yes Historical Provider, MD  cholecalciferol (VITAMIN D)  1000 units tablet Take 1,000 Units by mouth daily.   Yes Historical Provider, MD  divalproex (DEPAKOTE ER) 500 MG 24 hr tablet Take 500 mg by mouth 2 (two) times daily.    Yes Historical Provider, MD  docusate sodium (COLACE) 100 MG capsule Take 100 mg by mouth 2 (two) times daily.   Yes Historical Provider, MD  folic acid (FOLVITE) 400 MCG tablet Take 400 mcg by mouth daily.   Yes Historical Provider, MD  haloperidol (HALDOL) 10 MG tablet Take 10 mg by mouth 2 (two) times daily.   Yes Historical Provider, MD  HYDROcodone-acetaminophen (NORCO) 5-325 MG tablet Take 2 tablets by mouth every 4 (four) hours as needed. 05/20/15  Yes Mancel Bale, MD  levothyroxine (SYNTHROID, LEVOTHROID) 50 MCG tablet Take 50 mcg by mouth daily before breakfast.   Yes Historical Provider, MD  Melatonin 3 MG TABS Take 3 mg by mouth at bedtime.   Yes Historical Provider, MD  Multiple Vitamin (MULTIVITAMIN WITH MINERALS) TABS tablet Take 1 tablet by mouth daily.   Yes Historical Provider, MD  OLANZapine (ZYPREXA) 20 MG tablet Take 20 mg by mouth daily.   Yes Historical Provider, MD  omeprazole (PRILOSEC) 20 MG capsule Take 20 mg by mouth daily.   Yes Historical Provider, MD  traZODone (DESYREL) 50 MG tablet Take 25-50 mg by mouth 2 (two) times daily. *Take one tablet daily in  the morning and one-half tablet daily at bedtime   Yes Historical Provider, MD   BP 135/76 mmHg  Pulse 98  Temp(Src) 98.1 F (36.7 C) (Oral)  Resp 18  SpO2 98% Physical Exam  Constitutional: She is oriented to person, place, and time. She appears well-developed and well-nourished.  HENT:  Head: Normocephalic and atraumatic.  Eyes: Conjunctivae and EOM are normal. Pupils are equal, round, and reactive to light.  Neck: Normal range of motion. Neck supple.  Cardiovascular: Normal rate and regular rhythm.   Pulmonary/Chest: Effort normal and breath sounds normal.  Abdominal: Soft. Bowel sounds are normal.  Musculoskeletal:  Left lower  extremity: Tenderness surrounding the left ankle and generalized tenderness of the dorsum of the midfoot.  Neurological: She is alert and oriented to person, place, and time.  Skin: Skin is warm and dry.  Psychiatric: She has a normal mood and affect. Her behavior is normal.  Nursing note and vitals reviewed.   ED Course  Procedures (including critical care time) Labs Review Labs Reviewed - No data to display  Imaging Review Dg Ankle Complete Left  05/30/2015  CLINICAL DATA:  Fall on 05/20/2015, increasing redness and pain left foot EXAM: LEFT ANKLE COMPLETE - 3+ VIEW COMPARISON:  Left ankle 05/20/2015 FINDINGS: Three views of the left ankle submitted. No acute fracture or subluxation. Again noted diffuse osteopenia. Ankle mortise is preserved. There is diffuse mild soft tissue swelling left ankle and left foot. IMPRESSION: No acute fracture or subluxation. Diffuse osteopenia again noted. Diffuse mild soft tissue swelling left ankle and left foot Electronically Signed   By: Natasha MeadLiviu  Pop M.D.   On: 05/30/2015 17:00   Dg Foot Complete Left  05/30/2015  CLINICAL DATA:  Recent metatarsal fracture. Redness and swelling and pain. Rule out infection EXAM: LEFT FOOT - COMPLETE 3+ VIEW COMPARISON:  05/20/2015 FINDINGS: Nondisplaced fractures of the proximal fourth and fifth metacarpal similar to the prior study. No periosteal reaction. There is overlying soft tissue swelling. Nondisplaced fractures of the third and fourth metatarsals distally also unchanged and without evidence of displacement or bone destruction. Minimal periosteal reaction. No new fracture.  No evidence of osteomyelitis. IMPRESSION: Little change in fractures of the third, fourth and fifth metatarsals. There is overlying soft tissue swelling but no evidence of osteomyelitis. Electronically Signed   By: Marlan Palauharles  Clark M.D.   On: 05/30/2015 16:16   I have personally reviewed and evaluated these images and lab results as part of my medical  decision-making.   EKG Interpretation None      MDM   Final diagnoses:  Fall, initial encounter  Left foot pain  Metatarsal fracture, left, with routine healing, subsequent encounter    X-ray results as above. There is no new fractures. Patient will continue her support boot, ice, elevate, follow-up with orthopedics.    Donnetta HutchingBrian Shantella Blubaugh, MD 05/30/15 [phone]

## 2016-06-20 ENCOUNTER — Emergency Department (HOSPITAL_COMMUNITY)
Admission: EM | Admit: 2016-06-20 | Discharge: 2016-06-20 | Disposition: A | Discharge status: Home / Self Care | Payer: Medicare Other | Source: Home / Self Care | Attending: Emergency Medicine | Admitting: Emergency Medicine

## 2016-06-20 ENCOUNTER — Emergency Department (HOSPITAL_COMMUNITY): Payer: Medicare Other

## 2016-06-20 ENCOUNTER — Encounter (HOSPITAL_COMMUNITY): Payer: Self-pay | Admitting: Emergency Medicine

## 2016-06-20 DIAGNOSIS — J449 Chronic obstructive pulmonary disease, unspecified: Secondary | ICD-10-CM

## 2016-06-20 DIAGNOSIS — Z79899 Other long term (current) drug therapy: Secondary | ICD-10-CM | POA: Insufficient documentation

## 2016-06-20 DIAGNOSIS — N390 Urinary tract infection, site not specified: Secondary | ICD-10-CM | POA: Diagnosis not present

## 2016-06-20 DIAGNOSIS — F17210 Nicotine dependence, cigarettes, uncomplicated: Secondary | ICD-10-CM | POA: Insufficient documentation

## 2016-06-20 DIAGNOSIS — N3000 Acute cystitis without hematuria: Secondary | ICD-10-CM

## 2016-06-20 DIAGNOSIS — E039 Hypothyroidism, unspecified: Secondary | ICD-10-CM | POA: Insufficient documentation

## 2016-06-20 LAB — COMPREHENSIVE METABOLIC PANEL
ALBUMIN: 3.2 g/dL — AB (ref 3.5–5.0)
ALK PHOS: 65 U/L (ref 38–126)
ALT: 11 U/L — AB (ref 14–54)
AST: 14 U/L — AB (ref 15–41)
Anion gap: 9 (ref 5–15)
BUN: 23 mg/dL — ABNORMAL HIGH (ref 6–20)
CALCIUM: 9.1 mg/dL (ref 8.9–10.3)
CO2: 27 mmol/L (ref 22–32)
CREATININE: 0.92 mg/dL (ref 0.44–1.00)
Chloride: 104 mmol/L (ref 101–111)
GFR calc Af Amer: >60 mL/min (ref >60)
GFR calc non Af Amer: >60 mL/min (ref >60)
Glucose, Bld: 162 mg/dL — ABNORMAL HIGH (ref 65–99)
Potassium: 3.4 mmol/L — ABNORMAL LOW (ref 3.5–5.1)
Sodium: 140 mmol/L (ref 135–145)
TOTAL PROTEIN: 6.6 g/dL (ref 6.5–8.1)
Total Bilirubin: 0.7 mg/dL (ref 0.3–1.2)

## 2016-06-20 LAB — CBC WITH DIFFERENTIAL/PLATELET
BASOS ABS: 0 10*3/uL (ref 0.0–0.1)
BASOS PCT: 0 %
Eosinophils Absolute: 0 10*3/uL (ref 0.0–0.7)
Eosinophils Relative: 0 %
HEMATOCRIT: 39 % (ref 36.0–46.0)
HEMOGLOBIN: 13.4 g/dL (ref 12.0–15.0)
Lymphocytes Relative: 15 %
Lymphs Abs: 1.6 10*3/uL (ref 0.7–4.0)
MCH: 30 pg (ref 26.0–34.0)
MCHC: 34.4 g/dL (ref 30.0–36.0)
MCV: 87.2 fL (ref 78.0–100.0)
Monocytes Absolute: 1.4 10*3/uL — ABNORMAL HIGH (ref 0.1–1.0)
Monocytes Relative: 13 %
NEUTROS ABS: 7.9 10*3/uL — AB (ref 1.7–7.7)
NEUTROS PCT: 72 %
Platelets: 139 10*3/uL — ABNORMAL LOW (ref 150–400)
RBC: 4.47 MIL/uL (ref 3.87–5.11)
RDW: 13.8 % (ref 11.5–15.5)
WBC: 10.8 10*3/uL — ABNORMAL HIGH (ref 4.0–10.5)

## 2016-06-20 LAB — URINALYSIS, ROUTINE W REFLEX MICROSCOPIC
Bilirubin Urine: NEGATIVE
GLUCOSE, UA: NEGATIVE mg/dL
Hgb urine dipstick: NEGATIVE
Ketones, ur: 5 mg/dL — AB
NITRITE: NEGATIVE
PROTEIN: 100 mg/dL — AB
SPECIFIC GRAVITY, URINE: 1.015 (ref 1.005–1.030)
pH: 7 (ref 5.0–8.0)

## 2016-06-20 LAB — I-STAT CG4 LACTIC ACID, ED: Lactic Acid, Venous: 0.7 mmol/L (ref 0.5–1.9)

## 2016-06-20 LAB — TROPONIN I

## 2016-06-20 MED ORDER — DEXTROSE 5 % IV SOLN
1.0000 g | Freq: Once | INTRAVENOUS | Status: AC
  Administered 2016-06-20: 1 g via INTRAVENOUS
  Filled 2016-06-20: qty 10

## 2016-06-20 MED ORDER — CEPHALEXIN 500 MG PO CAPS: 500.0000 mg | ORAL_CAPSULE | Freq: Four times a day (QID) | ORAL | 0 refills | Status: DC

## 2016-06-20 MED ORDER — SODIUM CHLORIDE 0.9 % IV BOLUS (SEPSIS)
1000.0000 mL | Freq: Once | INTRAVENOUS | Status: AC
  Administered 2016-06-20: 1000 mL via INTRAVENOUS

## 2016-06-20 NOTE — ED Triage Notes (Signed)
Pick up by [facility] for fever.  c/o AMS per facility, temp 100.9 per EMS.  Denies any pain.  Given tylenol 650 mg po.

## 2016-06-20 NOTE — Discharge Instructions (Signed)
Drink plenty of fluids. Take Tylenol for fever. Follow-up with your doctor next week for recheck

## 2016-06-20 NOTE — ED Provider Notes (Signed)
[facility]-EMERGENCY DEPT Provider Note   CSN: [id number] Arrival date & time: 06/20/16  1713     History   Chief Complaint Chief Complaint  Patient presents with  . Fever    TEMP 100.9    HPI Tina Ellison is a 71 y.o. female.  Patient complains of weakness and fever   The history is provided by the patient. No language interpreter was used.  Fever   This is a new problem. The current episode started 12 to 24 hours ago. The problem occurs constantly. The problem has not changed since onset.The maximum temperature noted was 100 to 100.9 F. Pertinent negatives include no chest pain, no diarrhea, no congestion, no headaches and no cough. She has tried nothing for the symptoms.    Past Medical History:  Diagnosis Date  . Anxiety   . Anxiety disorder   . COPD (chronic obstructive pulmonary disease) (HCC)   . Depression   . GERD (gastroesophageal reflux disease)   . Hypothyroidism   . Memory loss   . Osteoporosis   . Overactive bladder   . Schizoaffective disorder (HCC)   . Schizophrenia (HCC)   . Thrombocytopenia (HCC)   . Urinary retention   . UTI (lower urinary tract infection)   . Vitamin deficiency   . Weakness     Patient Active Problem List   Diagnosis Date Noted  . Disorganized schizophrenia (HCC)   . Schizophrenia (HCC) 07/07/2014  . Diabetes (HCC) 07/07/2014    Past Surgical History:  Procedure Laterality Date  . BREAST SURGERY Right     OB History    No data available       Home Medications    Prior to Admission medications   Medication Sig Start Date End Date Taking? Authorizing Provider  alendronate (FOSAMAX) 70 MG tablet Take 70 mg by mouth once a week. Take with a full glass of water on an empty stomach. Every Tuesday   Yes Historical Provider, MD  atorvastatin (LIPITOR) 10 MG tablet Take 10 mg by mouth every evening.   Yes Historical Provider, MD  benztropine (COGENTIN) 1 MG tablet Take 1 mg by mouth 2 (two) times daily.   Yes Historical  Provider, MD  cholecalciferol (VITAMIN D) 1000 units tablet Take 1,000 Units by mouth daily.   Yes Historical Provider, MD  divalproex (DEPAKOTE ER) 500 MG 24 hr tablet Take 500 mg by mouth 2 (two) times daily.    Yes Historical Provider, MD  docusate sodium (COLACE) 100 MG capsule Take 100 mg by mouth 2 (two) times daily.   Yes Historical Provider, MD  folic acid (FOLVITE) 400 MCG tablet Take 400 mcg by mouth daily.   Yes Historical Provider, MD  haloperidol (HALDOL) 10 MG tablet Take 10 mg by mouth 2 (two) times daily.   Yes Historical Provider, MD  levothyroxine (SYNTHROID, LEVOTHROID) 50 MCG tablet Take 50 mcg by mouth daily before breakfast.   Yes Historical Provider, MD  Melatonin 3 MG TABS Take 3 mg by mouth at bedtime.   Yes Historical Provider, MD  Multiple Vitamin (MULTIVITAMIN WITH MINERALS) TABS tablet Take 1 tablet by mouth daily.   Yes Historical Provider, MD  OLANZapine (ZYPREXA) 20 MG tablet Take 20 mg by mouth daily.   Yes Historical Provider, MD  omeprazole (PRILOSEC) 20 MG capsule Take 20 mg by mouth daily.   Yes Historical Provider, MD  oxybutynin (DITROPAN) 5 MG tablet Take 5 mg by mouth daily.   Yes Historical Provider, MD  traZODone (  DESYREL) 50 MG tablet Take 25-50 mg by mouth 2 (two) times daily. *Take one tablet daily in the morning and one-half tablet daily at bedtime   Yes Historical Provider, MD  cephALEXin (KEFLEX) 500 MG capsule Take 1 capsule (500 mg total) by mouth 4 (four) times daily. 06/20/16   Bethann Berkshire, MD    Family History History reviewed. No pertinent family history.  Social History Social History  Substance Use Topics  . Smoking status: Current Every Day Smoker    Packs/day: 0.50    Types: Cigarettes  . Smokeless tobacco: Never Used  . Alcohol use No     Allergies   Haldol [haloperidol]   Review of Systems Review of Systems  Constitutional: Positive for fatigue and fever. Negative for appetite change.  HENT: Negative for congestion, ear  discharge and sinus pressure.   Eyes: Negative for discharge.  Respiratory: Negative for cough.   Cardiovascular: Negative for chest pain.  Gastrointestinal: Negative for abdominal pain and diarrhea.  Genitourinary: Negative for frequency and hematuria.  Musculoskeletal: Negative for back pain.  Skin: Negative for rash.  Neurological: Negative for seizures and headaches.  Psychiatric/Behavioral: Negative for hallucinations.     Physical Exam Updated Vital Signs BP 127/83 (BP Location: Left Arm)   Pulse 98   Temp 99.2 F (37.3 C) (Oral)   Resp 16   Ht  (1.727 m)   Wt 150 lb (68 kg)   SpO2 92%   BMI 22.81 kg/m   Physical Exam  Constitutional: She appears well-developed.  HENT:  Head: Normocephalic.  Eyes: Conjunctivae and EOM are normal. No scleral icterus.  Neck: Neck supple. No thyromegaly present.  Cardiovascular: Normal rate and regular rhythm.  Exam reveals no gallop and no friction rub.   No murmur heard. Pulmonary/Chest: No stridor. She has no wheezes. She has no rales. She exhibits no tenderness.  Abdominal: She exhibits no distension. There is no tenderness. There is no rebound.  Musculoskeletal: Normal range of motion. She exhibits no edema.  Lymphadenopathy:    She has no cervical adenopathy.  Neurological: She exhibits normal muscle tone. Coordination normal.  Patient lethargic oriented to person and place  Skin: No rash noted. No erythema.  Psychiatric: Her behavior is normal.     ED Treatments / Results  Labs (all labs ordered are listed, but only abnormal results are displayed) Labs Reviewed  CBC WITH DIFFERENTIAL/PLATELET - Abnormal; Notable for the following:       Result Value   WBC 10.8 (*)    Platelets 139 (*)    Neutro Abs 7.9 (*)    Monocytes Absolute 1.4 (*)    All other components within normal limits  COMPREHENSIVE METABOLIC PANEL - Abnormal; Notable for the following:    Potassium 3.4 (*)    Glucose, Bld 162 (*)    BUN 23 (*)      Albumin 3.2 (*)    AST 14 (*)    ALT 11 (*)    All other components within normal limits  URINALYSIS, ROUTINE W REFLEX MICROSCOPIC - Abnormal; Notable for the following:    Color, Urine AMBER (*)    APPearance TURBID (*)    Ketones, ur 5 (*)    Protein, ur 100 (*)    Leukocytes, UA LARGE (*)    Bacteria, UA FEW (*)    Squamous Epithelial / LPF 0-5 (*)    All other components within normal limits  URINE CULTURE  TROPONIN I  I-STAT CG4 LACTIC ACID,  ED    EKG  EKG Interpretation None       Radiology Dg Chest 2 View  Result Date: 06/20/2016 CLINICAL DATA:  Fever and altered mental status EXAM: CHEST  2 VIEW COMPARISON:  02/14/2015 FINDINGS: The heart size and mediastinal contours are within normal limits. Both lungs are clear. The visualized skeletal structures are unremarkable. IMPRESSION: No active cardiopulmonary disease. Electronically Signed   By: Alcide Clever M.D.   On: 06/20/2016 18:49    Procedures Procedures (including critical care time)  Medications Ordered in ED Medications  cefTRIAXone (ROCEPHIN) 1 g in dextrose 5 % 50 mL IVPB (not administered)  sodium chloride 0.9 % bolus 1,000 mL (1,000 mLs Intravenous New Bag/Given 06/20/16 1818)     Initial Impression / Assessment and Plan / ED Course  I have reviewed the triage vital signs and the nursing notes.  Pertinent labs & imaging results that were available during my care of the patient were reviewed by me and considered in my medical decision making (see chart for details).     Patient a urinary tract infection with fever. Patient does not look toxic. She will be put on Keflex and will follow-up with her PCP  Final Clinical Impressions(s) / ED Diagnoses   Final diagnoses:  Acute cystitis without hematuria    New Prescriptions New Prescriptions   CEPHALEXIN (KEFLEX) 500 MG CAPSULE    Take 1 capsule (500 mg total) by mouth 4 (four) times daily.     Bethann Berkshire, MD 06/20/16 [phone]

## 2016-06-20 NOTE — ED Triage Notes (Signed)
Dr Zammit at bedside. 

## 2016-06-20 NOTE — ED Notes (Signed)
Pt refused in an out cath.

## 2016-06-21 ENCOUNTER — Emergency Department (HOSPITAL_COMMUNITY): Payer: Medicare Other

## 2016-06-21 ENCOUNTER — Encounter (HOSPITAL_COMMUNITY): Payer: Self-pay | Admitting: Emergency Medicine

## 2016-06-21 ENCOUNTER — Inpatient Hospital Stay (HOSPITAL_COMMUNITY)
Admission: EM | Admit: 2016-06-21 | Discharge: 2016-06-23 | DRG: 689 | Disposition: A | Discharge status: Home / Self Care | Payer: Medicare Other | Attending: Internal Medicine | Admitting: Internal Medicine

## 2016-06-21 DIAGNOSIS — Z888 Allergy status to other drugs, medicaments and biological substances status: Secondary | ICD-10-CM | POA: Diagnosis not present

## 2016-06-21 DIAGNOSIS — Z79899 Other long term (current) drug therapy: Secondary | ICD-10-CM | POA: Diagnosis not present

## 2016-06-21 DIAGNOSIS — F419 Anxiety disorder, unspecified: Secondary | ICD-10-CM | POA: Diagnosis present

## 2016-06-21 DIAGNOSIS — M810 Age-related osteoporosis without current pathological fracture: Secondary | ICD-10-CM | POA: Diagnosis present

## 2016-06-21 DIAGNOSIS — E559 Vitamin D deficiency, unspecified: Secondary | ICD-10-CM | POA: Diagnosis present

## 2016-06-21 DIAGNOSIS — E039 Hypothyroidism, unspecified: Secondary | ICD-10-CM | POA: Diagnosis present

## 2016-06-21 DIAGNOSIS — Z7983 Long term (current) use of bisphosphonates: Secondary | ICD-10-CM | POA: Diagnosis not present

## 2016-06-21 DIAGNOSIS — G9340 Encephalopathy, unspecified: Secondary | ICD-10-CM | POA: Diagnosis present

## 2016-06-21 DIAGNOSIS — N3000 Acute cystitis without hematuria: Secondary | ICD-10-CM

## 2016-06-21 DIAGNOSIS — R32 Unspecified urinary incontinence: Secondary | ICD-10-CM | POA: Diagnosis present

## 2016-06-21 DIAGNOSIS — N390 Urinary tract infection, site not specified: Secondary | ICD-10-CM | POA: Diagnosis present

## 2016-06-21 DIAGNOSIS — N3281 Overactive bladder: Secondary | ICD-10-CM | POA: Diagnosis present

## 2016-06-21 DIAGNOSIS — F201 Disorganized schizophrenia: Secondary | ICD-10-CM

## 2016-06-21 DIAGNOSIS — F17210 Nicotine dependence, cigarettes, uncomplicated: Secondary | ICD-10-CM | POA: Diagnosis present

## 2016-06-21 DIAGNOSIS — F329 Major depressive disorder, single episode, unspecified: Secondary | ICD-10-CM | POA: Diagnosis present

## 2016-06-21 DIAGNOSIS — F209 Schizophrenia, unspecified: Secondary | ICD-10-CM | POA: Diagnosis present

## 2016-06-21 DIAGNOSIS — J9811 Atelectasis: Secondary | ICD-10-CM | POA: Diagnosis present

## 2016-06-21 DIAGNOSIS — F259 Schizoaffective disorder, unspecified: Secondary | ICD-10-CM | POA: Diagnosis present

## 2016-06-21 DIAGNOSIS — K219 Gastro-esophageal reflux disease without esophagitis: Secondary | ICD-10-CM | POA: Diagnosis present

## 2016-06-21 DIAGNOSIS — R319 Hematuria, unspecified: Secondary | ICD-10-CM

## 2016-06-21 DIAGNOSIS — E119 Type 2 diabetes mellitus without complications: Secondary | ICD-10-CM

## 2016-06-21 DIAGNOSIS — J449 Chronic obstructive pulmonary disease, unspecified: Secondary | ICD-10-CM | POA: Diagnosis present

## 2016-06-21 LAB — BASIC METABOLIC PANEL
Anion gap: 8 (ref 5–15)
BUN: 16 mg/dL (ref 6–20)
CALCIUM: 8.4 mg/dL — AB (ref 8.9–10.3)
CHLORIDE: 105 mmol/L (ref 101–111)
CO2: 27 mmol/L (ref 22–32)
CREATININE: 0.83 mg/dL (ref 0.44–1.00)
GFR calc non Af Amer: >60 mL/min (ref >60)
GLUCOSE: 125 mg/dL — AB (ref 65–99)
Potassium: 3.8 mmol/L (ref 3.5–5.1)
Sodium: 140 mmol/L (ref 135–145)

## 2016-06-21 LAB — LACTIC ACID, PLASMA
Lactic Acid, Venous: 0.8 mmol/L (ref 0.5–1.9)
Lactic Acid, Venous: 1.1 mmol/L (ref 0.5–1.9)

## 2016-06-21 LAB — CBC WITH DIFFERENTIAL/PLATELET
Basophils Absolute: 0 10*3/uL (ref 0.0–0.1)
Basophils Relative: 0 %
Eosinophils Absolute: 0 10*3/uL (ref 0.0–0.7)
Eosinophils Relative: 0 %
HEMATOCRIT: 40.3 % (ref 36.0–46.0)
HEMOGLOBIN: 13.5 g/dL (ref 12.0–15.0)
LYMPHS ABS: 2.2 10*3/uL (ref 0.7–4.0)
LYMPHS PCT: 14 %
MCH: 29.5 pg (ref 26.0–34.0)
MCHC: 33.5 g/dL (ref 30.0–36.0)
MCV: 88.2 fL (ref 78.0–100.0)
MONO ABS: 2.4 10*3/uL — AB (ref 0.1–1.0)
MONOS PCT: 15 %
NEUTROS ABS: 11.3 10*3/uL — AB (ref 1.7–7.7)
Neutrophils Relative %: 71 %
Platelets: 142 10*3/uL — ABNORMAL LOW (ref 150–400)
RBC: 4.57 MIL/uL (ref 3.87–5.11)
RDW: 13.8 % (ref 11.5–15.5)
WBC: 16 10*3/uL — ABNORMAL HIGH (ref 4.0–10.5)

## 2016-06-21 LAB — URINALYSIS, ROUTINE W REFLEX MICROSCOPIC
BILIRUBIN URINE: NEGATIVE
GLUCOSE, UA: NEGATIVE mg/dL
KETONES UR: 15 mg/dL — AB
Nitrite: NEGATIVE
PH: 7 (ref 5.0–8.0)
Protein, ur: 100 mg/dL — AB
SPECIFIC GRAVITY, URINE: 1.025 (ref 1.005–1.030)

## 2016-06-21 LAB — URINALYSIS, MICROSCOPIC (REFLEX): SQUAMOUS EPITHELIAL / LPF: NONE SEEN

## 2016-06-21 LAB — GLUCOSE, CAPILLARY
Glucose-Capillary: 116 mg/dL — ABNORMAL HIGH (ref 65–99)
Glucose-Capillary: 120 mg/dL — ABNORMAL HIGH (ref 65–99)

## 2016-06-21 MED ORDER — SODIUM CHLORIDE 0.9 % IV SOLN
INTRAVENOUS | Status: DC
  Administered 2016-06-21 – 2016-06-23 (×2): via INTRAVENOUS

## 2016-06-21 MED ORDER — PANTOPRAZOLE SODIUM 40 MG PO TBEC
40.0000 mg | DELAYED_RELEASE_TABLET | Freq: Every day | ORAL | Status: DC
  Administered 2016-06-21 – 2016-06-23 (×3): 40 mg via ORAL
  Filled 2016-06-21 (×3): qty 1

## 2016-06-21 MED ORDER — ENOXAPARIN SODIUM 40 MG/0.4ML ~~LOC~~ SOLN
40.0000 mg | SUBCUTANEOUS | Status: DC
  Administered 2016-06-21: 40 mg via SUBCUTANEOUS
  Filled 2016-06-21: qty 0.4

## 2016-06-21 MED ORDER — TRAZODONE HCL 50 MG PO TABS: 25.0000 mg | ORAL_TABLET | Freq: Two times a day (BID) | ORAL | Status: DC

## 2016-06-21 MED ORDER — ACETAMINOPHEN 650 MG RE SUPP: 650.0000 mg | Freq: Four times a day (QID) | RECTAL | Status: DC | PRN

## 2016-06-21 MED ORDER — ONDANSETRON HCL 4 MG/2ML IJ SOLN
4.0000 mg | Freq: Four times a day (QID) | INTRAMUSCULAR | Status: DC | PRN
Start: 2016-06-21 — End: 2016-06-23

## 2016-06-21 MED ORDER — FOLIC ACID 1 MG PO TABS
0.5000 mg | ORAL_TABLET | Freq: Every day | ORAL | Status: DC
  Administered 2016-06-21 – 2016-06-23 (×3): 0.5 mg via ORAL
  Filled 2016-06-21 (×3): qty 1

## 2016-06-21 MED ORDER — HALOPERIDOL 5 MG PO TABS
10.0000 mg | ORAL_TABLET | Freq: Two times a day (BID) | ORAL | Status: DC
  Administered 2016-06-21 – 2016-06-22 (×3): 10 mg via ORAL
  Filled 2016-06-21 (×4): qty 2

## 2016-06-21 MED ORDER — ATORVASTATIN CALCIUM 10 MG PO TABS
10.0000 mg | ORAL_TABLET | Freq: Every evening | ORAL | Status: DC
  Administered 2016-06-21: 10 mg via ORAL
  Filled 2016-06-21: qty 1

## 2016-06-21 MED ORDER — SODIUM CHLORIDE 0.9 % IV SOLN
1000.0000 mL | Freq: Once | INTRAVENOUS | Status: AC
  Administered 2016-06-21: 1000 mL via INTRAVENOUS

## 2016-06-21 MED ORDER — VITAMIN D 1000 UNITS PO TABS
1000.0000 [IU] | ORAL_TABLET | Freq: Every day | ORAL | Status: DC
  Administered 2016-06-21 – 2016-06-23 (×3): 1000 [IU] via ORAL
  Filled 2016-06-21 (×3): qty 1

## 2016-06-21 MED ORDER — DEXTROSE 5 % IV SOLN
1.0000 g | Freq: Once | INTRAVENOUS | Status: AC
  Administered 2016-06-21: 1 g via INTRAVENOUS
  Filled 2016-06-21: qty 10

## 2016-06-21 MED ORDER — DEXTROSE 5 % IV SOLN
1.0000 g | INTRAVENOUS | Status: DC
  Administered 2016-06-22 – 2016-06-23 (×2): 1 g via INTRAVENOUS
  Filled 2016-06-21 (×4): qty 10

## 2016-06-21 MED ORDER — ACETAMINOPHEN 325 MG PO TABS: 650.0000 mg | ORAL_TABLET | Freq: Four times a day (QID) | ORAL | Status: DC | PRN

## 2016-06-21 MED ORDER — DIVALPROEX SODIUM ER 500 MG PO TB24
500.0000 mg | ORAL_TABLET | Freq: Two times a day (BID) | ORAL | Status: DC
  Administered 2016-06-21 – 2016-06-23 (×4): 500 mg via ORAL
  Filled 2016-06-21 (×4): qty 1

## 2016-06-21 MED ORDER — TRAZODONE HCL 50 MG PO TABS
25.0000 mg | ORAL_TABLET | Freq: Every day | ORAL | Status: DC
  Administered 2016-06-21 – 2016-06-22 (×2): 25 mg via ORAL
  Filled 2016-06-21 (×2): qty 1

## 2016-06-21 MED ORDER — INSULIN ASPART 100 UNIT/ML ~~LOC~~ SOLN
3.0000 [IU] | Freq: Three times a day (TID) | SUBCUTANEOUS | Status: DC
  Administered 2016-06-22 – 2016-06-23 (×3): 3 [IU] via SUBCUTANEOUS

## 2016-06-21 MED ORDER — SODIUM CHLORIDE 0.9 % IV SOLN
1000.0000 mL | INTRAVENOUS | Status: DC
  Administered 2016-06-21: 1000 mL via INTRAVENOUS

## 2016-06-21 MED ORDER — OXYBUTYNIN CHLORIDE 5 MG PO TABS
5.0000 mg | ORAL_TABLET | Freq: Every day | ORAL | Status: DC
  Administered 2016-06-21 – 2016-06-23 (×3): 5 mg via ORAL
  Filled 2016-06-21 (×3): qty 1

## 2016-06-21 MED ORDER — DOCUSATE SODIUM 100 MG PO CAPS
100.0000 mg | ORAL_CAPSULE | Freq: Two times a day (BID) | ORAL | Status: DC
  Administered 2016-06-21 – 2016-06-23 (×4): 100 mg via ORAL
  Filled 2016-06-21 (×4): qty 1

## 2016-06-21 MED ORDER — ONDANSETRON HCL 4 MG PO TABS: 4.0000 mg | ORAL_TABLET | Freq: Four times a day (QID) | ORAL | Status: DC | PRN

## 2016-06-21 MED ORDER — MELATONIN 3 MG PO TABS: 3.0000 mg | ORAL_TABLET | Freq: Every day | ORAL | Status: DC

## 2016-06-21 MED ORDER — ADULT MULTIVITAMIN W/MINERALS CH
1.0000 | ORAL_TABLET | Freq: Every day | ORAL | Status: DC
  Administered 2016-06-21 – 2016-06-23 (×3): 1 via ORAL
  Filled 2016-06-21 (×3): qty 1

## 2016-06-21 MED ORDER — INSULIN ASPART 100 UNIT/ML ~~LOC~~ SOLN: 0.0000 [IU] | Freq: Three times a day (TID) | SUBCUTANEOUS | Status: DC

## 2016-06-21 MED ORDER — TRAZODONE HCL 50 MG PO TABS
50.0000 mg | ORAL_TABLET | Freq: Every day | ORAL | Status: DC
  Administered 2016-06-22: 50 mg via ORAL
  Filled 2016-06-21 (×2): qty 1

## 2016-06-21 MED ORDER — OLANZAPINE 5 MG PO TABS
20.0000 mg | ORAL_TABLET | Freq: Every day | ORAL | Status: DC
  Administered 2016-06-21 – 2016-06-23 (×3): 20 mg via ORAL
  Filled 2016-06-21 (×3): qty 4

## 2016-06-21 MED ORDER — BENZTROPINE MESYLATE 1 MG PO TABS
1.0000 mg | ORAL_TABLET | Freq: Two times a day (BID) | ORAL | Status: DC
  Administered 2016-06-21 – 2016-06-22 (×3): 1 mg via ORAL
  Filled 2016-06-21 (×4): qty 1

## 2016-06-21 MED ORDER — INSULIN ASPART 100 UNIT/ML ~~LOC~~ SOLN: 0.0000 [IU] | Freq: Every day | SUBCUTANEOUS | Status: DC

## 2016-06-21 MED ORDER — SENNOSIDES-DOCUSATE SODIUM 8.6-50 MG PO TABS: 1.0000 | ORAL_TABLET | Freq: Every evening | ORAL | Status: DC | PRN

## 2016-06-21 MED ORDER — LEVOTHYROXINE SODIUM 50 MCG PO TABS
50.0000 ug | ORAL_TABLET | Freq: Every day | ORAL | Status: DC
  Administered 2016-06-22 – 2016-06-23 (×2): 50 ug via ORAL
  Filled 2016-06-21 (×2): qty 1

## 2016-06-21 NOTE — ED Notes (Signed)
Blood cultures x2 completed.

## 2016-06-21 NOTE — Progress Notes (Signed)

## 2016-06-21 NOTE — ED Provider Notes (Signed)
[facility]-EM[facility]Y DEPT Provider Note   CSN: [id number] Arrival date & time: 06/21/16  4098     History   Chief Complaint Chief Complaint  Patient presents with  . Urinary Tract Infection    HPI Shaylon Gillean is a 71 y.o. female.  LEVEL V CAVEAT.  Patient is a 71 year old female who presents to the emergency department because of fever, lethargy, and not receiving her medication.  The patient has a history of urinary tract infection, anxiety disorder,, chronic obstructive pulmonary disease, memory loss, schizophrenia, and thrombocytopenia.  The patient was evaluated in the emergency department on yesterday, May 2, due to altered mental status and fever. The patient is a resident of one of the local nursing facilities. And the history is primarily obtained from the nursing facility and the EMS personnel. The report given is that the patient was treated on yesterday with IV fluids and IV Rocephin because of a urinary tract infection. The patient was transferred back to the nursing facility, but they were unable to give the patient antibiotic medications. The patient remained lethargic. The patient had a temperature elevation on last evening requiring Tylenol. The supervising nurse at the facility determined that the patient should be returned to the emergency department for additional evaluation.   The history is provided by the EMS personnel and the nursing home.  Urinary Tract Infection   Associated symptoms include frequency.    Past Medical History:  Diagnosis Date  . Anxiety   . Anxiety disorder   . COPD (chronic obstructive pulmonary disease) (HCC)   . Depression   . GERD (gastroesophageal reflux disease)   . Hypothyroidism   . Memory loss   . Osteoporosis   . Overactive bladder   . Schizoaffective disorder (HCC)   . Schizophrenia (HCC)   . Thrombocytopenia (HCC)   . Urinary retention   . UTI (lower urinary tract infection)   . Vitamin deficiency   . Weakness      Patient Active Problem List   Diagnosis Date Noted  . Disorganized schizophrenia (HCC)   . Schizophrenia (HCC) 07/07/2014  . Diabetes (HCC) 07/07/2014    Past Surgical History:  Procedure Laterality Date  . BREAST SURGERY Right     OB History    No data available       Home Medications    Prior to Admission medications   Medication Sig Start Date End Date Taking? Authorizing Provider  alendronate (FOSAMAX) 70 MG tablet Take 70 mg by mouth once a week. Take with a full glass of water on an empty stomach. Every Tuesday    Historical Provider, MD  atorvastatin (LIPITOR) 10 MG tablet Take 10 mg by mouth every evening.    Historical Provider, MD  benztropine (COGENTIN) 1 MG tablet Take 1 mg by mouth 2 (two) times daily.    Historical Provider, MD  cephALEXin (KEFLEX) 500 MG capsule Take 1 capsule (500 mg total) by mouth 4 (four) times daily. 06/20/16   Bethann Berkshire, MD  cholecalciferol (VITAMIN D) 1000 units tablet Take 1,000 Units by mouth daily.    Historical Provider, MD  divalproex (DEPAKOTE ER) 500 MG 24 hr tablet Take 500 mg by mouth 2 (two) times daily.     Historical Provider, MD  docusate sodium (COLACE) 100 MG capsule Take 100 mg by mouth 2 (two) times daily.    Historical Provider, MD  folic acid (FOLVITE) 400 MCG tablet Take 400 mcg by mouth daily.    Historical Provider, MD  haloperidol (  HALDOL) 10 MG tablet Take 10 mg by mouth 2 (two) times daily.    Historical Provider, MD  levothyroxine (SYNTHROID, LEVOTHROID) 50 MCG tablet Take 50 mcg by mouth daily before breakfast.    Historical Provider, MD  Melatonin 3 MG TABS Take 3 mg by mouth at bedtime.    Historical Provider, MD  Multiple Vitamin (MULTIVITAMIN WITH MINERALS) TABS tablet Take 1 tablet by mouth daily.    Historical Provider, MD  OLANZapine (ZYPREXA) 20 MG tablet Take 20 mg by mouth daily.    Historical Provider, MD  omeprazole (PRILOSEC) 20 MG capsule Take 20 mg by mouth daily.    Historical Provider, MD   oxybutynin (DITROPAN) 5 MG tablet Take 5 mg by mouth daily.    Historical Provider, MD  traZODone (DESYREL) 50 MG tablet Take 25-50 mg by mouth 2 (two) times daily. *Take one tablet daily in the morning and one-half tablet daily at bedtime    Historical Provider, MD    Family History History reviewed. No pertinent family history.  Social History Social History  Substance Use Topics  . Smoking status: Current Every Day Smoker    Packs/day: 0.50    Types: Cigarettes  . Smokeless tobacco: Never Used  . Alcohol use No     Allergies   Haldol [haloperidol]   Review of Systems Review of Systems  Constitutional: Positive for activity change, appetite change, fatigue and fever.       Information obtained from the nursing facility and the EMS personnel. Patient is lethargic and not a trustworthy historian.  Genitourinary: Positive for frequency.  Psychiatric/Behavioral: Positive for confusion.  All other systems reviewed and are negative.    Physical Exam Updated Vital Signs BP 109/86   Pulse (!) 115   Temp 98.9 F (37.2 C) (Oral)   Resp 20   Ht 5\' 8"  (1.727 m)   Wt 68 kg   SpO2 93%   BMI 22.81 kg/m   Physical Exam  Constitutional: She appears well-developed and well-nourished. She appears lethargic.  Non-toxic appearance.  HENT:  Head: Normocephalic.  Right Ear: Tympanic membrane and external ear normal.  Left Ear: Tympanic membrane and external ear normal.  Eyes: EOM and lids are normal. Pupils are equal, round, and reactive to light.  Neck: Normal range of motion. Neck supple. Carotid bruit is not present.  Cardiovascular: Normal rate, regular rhythm, normal heart sounds, intact distal pulses and normal pulses.   Pulmonary/Chest: Breath sounds normal. No respiratory distress.  Abdominal: Soft. Bowel sounds are normal. There is tenderness in the suprapubic area. There is no guarding.  Musculoskeletal: Normal range of motion.  Lymphadenopathy:       Head (right  side): No submandibular adenopathy present.       Head (left side): No submandibular adenopathy present.    She has no cervical adenopathy.  Neurological: She has normal strength. She appears lethargic. No cranial nerve deficit or sensory deficit.  Patient is lethargic. She is sleeping during my examination. When she is aroused, there seems to be some element of confusion present.  Skin: Skin is warm and dry.  Psychiatric: She has a normal mood and affect. Her speech is normal.  Nursing note and vitals reviewed.    ED Treatments / Results  Labs (all labs ordered are listed, but only abnormal results are displayed) Labs Reviewed  BASIC METABOLIC PANEL  CBC WITH DIFFERENTIAL/PLATELET  URINALYSIS, ROUTINE W REFLEX MICROSCOPIC  LACTIC ACID, PLASMA  LACTIC ACID, PLASMA    EKG  EKG Interpretation None       Radiology Dg Chest 2 View  Result Date: 06/20/2016 CLINICAL DATA:  Fever and altered mental status EXAM: CHEST  2 VIEW COMPARISON:  02/14/2015 FINDINGS: The heart size and mediastinal contours are within normal limits. Both lungs are clear. The visualized skeletal structures are unremarkable. IMPRESSION: No active cardiopulmonary disease. Electronically Signed   By: Alcide Clever M.D.   On: 06/20/2016 18:49    Procedures Procedures (including critical care time)  Medications Ordered in ED Medications  0.9 %  sodium chloride infusion (not administered)    Followed by  0.9 %  sodium chloride infusion (not administered)     Initial Impression / Assessment and Plan / ED Course Pt seen with me by Dr Clayborne Dana.  I have reviewed the triage vital signs and the nursing notes.  Pertinent labs & imaging results that were available during my care of the patient were reviewed by me and considered in my medical decision making (see chart for details).       Final Clinical Impressions(s) / ED Diagnoses MDM This is the second emergency department visit within 24 hours for this  patient. We have contacted the nursing facility. The patient had some fevers last night. She had Tylenol in the early morning hours, and upon her arrival to the emergency department her temperature was 98.9. She has been tachycardic throughout her emergency department visit. And at times she has been tachypnea.  I reviewed the findings of the testing on yesterday. Today the electrolytes within normal limits. The kidney function seems to be within normal limits. The anion gap is normal at 8. The lactic acid is normal at 0.8. The complete blood count shows a white blood cells to be elevated beyond the evaluation on yesterday at 16,000. The platelets are slightly low at 142,000. The urinalysis shows a turbid yellow specimen with a specific gravity of 1.025. There is a large leukocyte esterase present. There is a moderate hemoglobin present. There are too many to count red cells and too many to count white cells present.  Initially on admission the pulse oximetry was running from 90-93% on room air. A chest x-ray was obtained and showed low lung volumes with mild bi-basilar atelectasis. There was no evidence for pneumothorax or acute pneumonias.  After IV fluids. The patient is becoming a little more awake and alert, less lethargic. We discussed the patient with hospital admitting service for possible admission.  Pt will be admitted. Pt a little more alert, remains tachycardic.   Final diagnoses:  Urinary tract infection with hematuria, site unspecified    New Prescriptions New Prescriptions   No medications on file     Ivery Quale, PA-C 06/21/16 1422    Marily Memos, MD 06/21/16 1452

## 2016-06-21 NOTE — ED Provider Notes (Signed)
On my exam patient is sleepy but arousable. When she is aroused she is a little bit agitated and confused and falls asleep easily. She is tachycardic with some mild crackles in the bases. She is afebrile with some tachypnea. She is barely seen here yesterday for UTI but the facility cannot get her medications. On my exam I think the patient has likely pyelonephritis or other systemic relation to her UTI and needs to be admitted for IV antibiotics and make sure her mental status improves and her hemodynamics do not get worse.   Marily MemosJason Leina Babe, MD 06/21/16 1452

## 2016-06-21 NOTE — ED Notes (Addendum)
Spoke with Tia from [facility] regarding pt's treatment.  To fax pt inform to ED.  [facility].  [phone]

## 2016-06-21 NOTE — H&P (Signed)
History and Physical    Tina Ellison UJW:[id number] DOB: 1945/10/22 DOA: 06/21/2016  Referring MD/NP/PA: Marily Memos, EDP PCP: Duffy Rhody, FNP  Patient coming from: SNF  Chief Complaint: Confusion  HPI: Tina Ellison is a 71 y.o. female brought in from her SNF due to acute encephalopathy. She was seen in the ED yesterday given IVF and rocephin and sent back to SNF with PO abx that she was apparently unable to take due to lethargy prompting her return visit today when she spiked a reported temp. EDP reports urine sample was frankly purulent and very malodorous. By the time I see her, she is awake and alert, tells me she feels uncomfortable because she is wet (she is incontinent). Admission requested.  Past Medical/Surgical History: Past Medical History:  Diagnosis Date  . Anxiety   . Anxiety disorder   . COPD (chronic obstructive pulmonary disease) (HCC)   . Depression   . GERD (gastroesophageal reflux disease)   . Hypothyroidism   . Memory loss   . Osteoporosis   . Overactive bladder   . Schizoaffective disorder (HCC)   . Schizophrenia (HCC)   . Thrombocytopenia (HCC)   . Urinary retention   . UTI (lower urinary tract infection)   . Vitamin deficiency   . Weakness     Past Surgical History:  Procedure Laterality Date  . BREAST SURGERY Right     Social History:  reports that she has been smoking Cigarettes.  She has been smoking about 0.50 packs per day. She has never used smokeless tobacco. She reports that she does not drink alcohol or use drugs.  Allergies: Allergies  Allergen Reactions  . Haldol [Haloperidol]     Patient reported-states dry mouth. No allergies are listed on MAR    Family History:  Unable to give me any details about her family history  Prior to Admission medications   Medication Sig Start Date End Date Taking? Authorizing Provider  alendronate (FOSAMAX) 70 MG tablet Take 70 mg by mouth once a week. Take with a full glass of  water on an empty stomach. Every Monday.   Yes Historical Provider, MD  atorvastatin (LIPITOR) 10 MG tablet Take 10 mg by mouth every evening.   Yes Historical Provider, MD  benztropine (COGENTIN) 1 MG tablet Take 1 mg by mouth 2 (two) times daily.   Yes Historical Provider, MD  cholecalciferol (VITAMIN D) 1000 units tablet Take 1,000 Units by mouth daily.   Yes Historical Provider, MD  divalproex (DEPAKOTE ER) 500 MG 24 hr tablet Take 500 mg by mouth 2 (two) times daily.    Yes Historical Provider, MD  docusate sodium (COLACE) 100 MG capsule Take 100 mg by mouth 2 (two) times daily.   Yes Historical Provider, MD  folic acid (FOLVITE) 400 MCG tablet Take 400 mcg by mouth daily.   Yes Historical Provider, MD  haloperidol (HALDOL) 10 MG tablet Take 10 mg by mouth 2 (two) times daily.   Yes Historical Provider, MD  levothyroxine (SYNTHROID, LEVOTHROID) 50 MCG tablet Take 50 mcg by mouth daily before breakfast.   Yes Historical Provider, MD  Melatonin 3 MG TABS Take 3 mg by mouth at bedtime.   Yes Historical Provider, MD  Multiple Vitamin (MULTIVITAMIN WITH MINERALS) TABS tablet Take 1 tablet by mouth daily.   Yes Historical Provider, MD  OLANZapine (ZYPREXA) 20 MG tablet Take 20 mg by mouth daily.   Yes Historical Provider, MD  omeprazole (PRILOSEC) 20 MG capsule Take 20 mg  by mouth daily.   Yes Historical Provider, MD  oxybutynin (DITROPAN) 5 MG tablet Take 5 mg by mouth daily.   Yes Historical Provider, MD  traZODone (DESYREL) 50 MG tablet Take 25-50 mg by mouth 2 (two) times daily. *Take one tablet daily in the morning and one-half tablet daily at bedtime   Yes Historical Provider, MD  cephALEXin (KEFLEX) 500 MG capsule Take 1 capsule (500 mg total) by mouth 4 (four) times daily. 06/20/16   Bethann Berkshire, MD    Review of Systems:  Constitutional: Denies diaphoresis, appetite change and fatigue.  HEENT: Denies photophobia, eye pain, redness, hearing loss, ear pain, congestion, sore throat,  rhinorrhea, sneezing, mouth sores, trouble swallowing, neck pain, neck stiffness and tinnitus.   Respiratory: Denies SOB, DOE, cough, chest tightness,  and wheezing.   Cardiovascular: Denies chest pain, palpitations and leg swelling.  Gastrointestinal: Denies nausea, vomiting, abdominal pain, diarrhea, constipation, blood in stool and abdominal distention.  Genitourinary: Denies dysuria, urgency,hematuria, flank pain and difficulty urinating.  Endocrine: Denies: hot or cold intolerance, sweats, changes in hair or nails, polyuria, polydipsia. Musculoskeletal: Denies myalgias, back pain, joint swelling, arthralgias and gait problem.  Skin: Denies pallor, rash and wound.  Neurological: Denies dizziness, seizures, syncope, weakness, light-headedness, numbness and headaches.  Hematological: Denies adenopathy. Easy bruising, personal or family bleeding history  Psychiatric/Behavioral: Denies suicidal ideation, mood changes, confusion, nervousness, sleep disturbance and agitation    Physical Exam: Vitals:   06/21/16 1215 06/21/16 1300 06/21/16 1330 06/21/16 1508  BP:  126/80 113/77 128/78  Pulse: (!) 117 (!) 114 (!) 109 (!) 109  Resp:  (!) 30 (!) 27 18  Temp:    99.1 F (37.3 C)  TempSrc:    Oral  SpO2: 96% 91% 94% 91%  Weight:    62.1 kg (136 lb 14.5 oz)  Height:         Constitutional: NAD, calm, comfortable Eyes: PERRL, lids and conjunctivae normal ENMT: Mucous membranes are moist. Posterior pharynx clear of any exudate or lesions.Normal dentition.  Neck: normal, supple, no masses, no thyromegaly Respiratory: clear to auscultation bilaterally, no wheezing, no crackles. Normal respiratory effort. No accessory muscle use.  Cardiovascular: Regular rate and rhythm, no murmurs / rubs / gallops. No extremity edema. 2+ pedal pulses. No carotid bruits.  Abdomen: no tenderness, no masses palpated. No hepatosplenomegaly. Bowel sounds positive.  Musculoskeletal: no clubbing / cyanosis. No joint  deformity upper and lower extremities. Good ROM, no contractures. Normal muscle tone.  Skin: no rashes, lesions, ulcers. No induration Neurologic: CN 2-12 grossly intact. Sensation intact, DTR normal. Strength 5/5 in all 4.  Psychiatric: Normal judgment and insight. Alert and oriented x 3. Normal mood.    Labs on Admission: I have personally reviewed the following labs and imaging studies  CBC:  Recent Labs Lab 06/20/16 1845 06/21/16 0950  WBC 10.8* 16.0*  NEUTROABS 7.9* 11.3*  HGB 13.4 13.5  HCT 39.0 40.3  MCV 87.2 88.2  PLT 139* 142*   Basic Metabolic Panel:  Recent Labs Lab 06/20/16 1845 06/21/16 1034  NA 140 140  K 3.4* 3.8  CL 104 105  CO2 27 27  GLUCOSE 162* 125*  BUN 23* 16  CREATININE 0.92 0.83  CALCIUM 9.1 8.4*   GFR: Estimated Creatinine Clearance: 63.6 mL/min (by C-G formula based on SCr of 0.83 mg/dL). Liver Function Tests:  Recent Labs Lab 06/20/16 1845  AST 14*  ALT 11*  ALKPHOS 65  BILITOT 0.7  PROT 6.6  ALBUMIN 3.2*  No results for input(s): LIPASE, AMYLASE in the last 168 hours. No results for input(s): AMMONIA in the last 168 hours. Coagulation Profile: No results for input(s): INR, PROTIME in the last 168 hours. Cardiac Enzymes:  Recent Labs Lab 06/20/16 1845  TROPONINI <0.03   BNP (last 3 results) No results for input(s): PROBNP in the last 8760 hours. HbA1C: No results for input(s): HGBA1C in the last 72 hours. CBG: No results for input(s): GLUCAP in the last 168 hours. Lipid Profile: No results for input(s): CHOL, HDL, LDLCALC, TRIG, CHOLHDL, LDLDIRECT in the last 72 hours. Thyroid Function Tests: No results for input(s): TSH, T4TOTAL, FREET4, T3FREE, THYROIDAB in the last 72 hours. Anemia Panel: No results for input(s): VITAMINB12, FOLATE, FERRITIN, TIBC, IRON, RETICCTPCT in the last 72 hours. Urine analysis:    Component Value Date/Time   COLORURINE YELLOW 06/21/2016 0937   APPEARANCEUR TURBID (A) 06/21/2016 0937    APPEARANCEUR Clear 08/31/2012 0455   LABSPEC 1.025 06/21/2016 0937   LABSPEC 1.007 08/31/2012 0455   PHURINE 7.0 06/21/2016 0937   GLUCOSEU NEGATIVE 06/21/2016 0937   GLUCOSEU >=500 08/31/2012 0455   HGBUR MODERATE (A) 06/21/2016 0937   BILIRUBINUR NEGATIVE 06/21/2016 0937   BILIRUBINUR Negative 08/31/2012 0455   KETONESUR 15 (A) 06/21/2016 0937   PROTEINUR 100 (A) 06/21/2016 0937   NITRITE NEGATIVE 06/21/2016 0937   LEUKOCYTESUR LARGE (A) 06/21/2016 0937   LEUKOCYTESUR Trace 08/31/2012 0455   Sepsis Labs: @LABRCNTIP (procalcitonin:4,lacticidven:4) ) Recent Results (from the past 240 hour(s))  Blood culture (routine x 2)     Status: None (Preliminary result)   Collection Time: 06/21/16  9:58 AM  Result Value Ref Range Status   Specimen Description BLOOD BLOOD RIGHT FOREARM  Final   Special Requests   Final    BOTTLES DRAWN AEROBIC AND ANAEROBIC Blood Culture adequate volume   Culture PENDING  Incomplete   Report Status PENDING  Incomplete  Blood culture (routine x 2)     Status: None (Preliminary result)   Collection Time: 06/21/16 12:15 PM  Result Value Ref Range Status   Specimen Description BLOOD LEFT ANTECUBITAL  Final   Special Requests   Final    BOTTLES DRAWN AEROBIC AND ANAEROBIC Blood Culture adequate volume   Culture PENDING  Incomplete   Report Status PENDING  Incomplete     Radiological Exams on Admission: Dg Chest 2 View  Result Date: 06/20/2016 CLINICAL DATA:  Fever and altered mental status EXAM: CHEST  2 VIEW COMPARISON:  02/14/2015 FINDINGS: The heart size and mediastinal contours are within normal limits. Both lungs are clear. The visualized skeletal structures are unremarkable. IMPRESSION: No active cardiopulmonary disease. Electronically Signed   By: Alcide CleverMark  Lukens M.D.   On: 06/20/2016 18:49   Dg Chest Portable 1 View  Result Date: 06/21/2016 CLINICAL DATA:  Tachycardia. EXAM: PORTABLE CHEST 1 VIEW COMPARISON:  06/20/2016.  02/14/2015. FINDINGS: Mediastinum  and hilar structures are unremarkable. Low lung volumes with mild basilar atelectasis. Right mid lung field pleuroparenchymal scarring again noted. No pleural effusion or pneumothorax. No acute bony abnormality . Surgical clips right chest. IMPRESSION: Low lung volumes with mild basilar atelectasis. Right mid lung field pleuroparenchymal scarring . Electronically Signed   By: Maisie Fushomas  Register   On: 06/21/2016 09:57    EKG: Independently reviewed. Sinus tachycardia, no acute changes  Assessment/Plan Principal Problem:   UTI (urinary tract infection) Active Problems:   Schizophrenia (HCC)   Diabetes (HCC)    UTI -Continue rocephin pending cx data. -Blood/urine cx requested.  Acute Encephalopathy -Likely due to UTI on top of baseline schizophrenia; seems mostly resolved at this point.  DM -Not at any medications at SNF for DM. -Check A1C. -Start on sensitive SSI and adjust regimen as needed.  Schizophrenia -Mood stable. -Continue zyprexa, haldol, depakote, benztropine,   DVT prophylaxis: lovenox  Code Status: full code  Family Communication: patient only  Disposition Plan: back to SNF in approx 48 hours once medically stable  Consults called: None  Admission status: inpatient    Time Spent: 75 minutes  Chaya Jan MD [facility] Pager [phone]  If 7PM-7AM, please contact night-coverage [email or web address] Password [email or web address]  06/21/2016, 4:04 PM

## 2016-06-21 NOTE — ED Triage Notes (Signed)
Pt seen yesterday and treated.  Pt went back to group home and have not received her medications.  EMS reports that pt was given tylenol this am.  Pt denies pain.

## 2016-06-22 LAB — BASIC METABOLIC PANEL
ANION GAP: 8 (ref 5–15)
BUN: 12 mg/dL (ref 6–20)
CHLORIDE: 105 mmol/L (ref 101–111)
CO2: 25 mmol/L (ref 22–32)
Calcium: 8 mg/dL — ABNORMAL LOW (ref 8.9–10.3)
Creatinine, Ser: 0.61 mg/dL (ref 0.44–1.00)
GFR calc non Af Amer: >60 mL/min (ref >60)
GLUCOSE: 96 mg/dL (ref 65–99)
POTASSIUM: 3.2 mmol/L — AB (ref 3.5–5.1)
Sodium: 138 mmol/L (ref 135–145)

## 2016-06-22 LAB — GLUCOSE, CAPILLARY
Glucose-Capillary: 82 mg/dL (ref 65–99)
Glucose-Capillary: 99 mg/dL (ref 65–99)
Glucose-Capillary: 88 mg/dL (ref 65–99)
Glucose-Capillary: 123 mg/dL — ABNORMAL HIGH (ref 65–99)

## 2016-06-22 LAB — HEMOGLOBIN A1C
Hgb A1c MFr Bld: 6 % — ABNORMAL HIGH (ref 4.8–5.6)
Mean Plasma Glucose: 126 mg/dL

## 2016-06-22 LAB — CBC
HEMATOCRIT: 35.6 % — AB (ref 36.0–46.0)
HEMOGLOBIN: 11.8 g/dL — AB (ref 12.0–15.0)
MCH: 29.4 pg (ref 26.0–34.0)
MCHC: 33.1 g/dL (ref 30.0–36.0)
MCV: 88.6 fL (ref 78.0–100.0)
Platelets: 141 10*3/uL — ABNORMAL LOW (ref 150–400)
RBC: 4.02 MIL/uL (ref 3.87–5.11)
RDW: 13.9 % (ref 11.5–15.5)
WBC: 11.3 10*3/uL — ABNORMAL HIGH (ref 4.0–10.5)

## 2016-06-22 LAB — MRSA PCR SCREENING: MRSA by PCR: NEGATIVE

## 2016-06-22 NOTE — Progress Notes (Signed)
PROGRESS NOTE    Tina Ellison  NWG:[id number] DOB: 26-Jan-1946 DOA: 06/21/2016 PCP: Duffy Rhody, FNP     Brief Narrative:  71 y/o woman admitted on 5/3 from her group home due to confusion. She was found to have a UTI and admission requested.   Assessment & Plan:   Principal Problem:   UTI (urinary tract infection) Active Problems:   Schizophrenia (HCC)   Diabetes (HCC)   UTI -Continue rocephin pending cx data. -Has been afebrile since admission.  Acute Encephalopathy -Seems improved. -Likely dur to UTI on top of her mental illness.  Schizophrenia -mood stable. -Continue zyprexa, haldol, depakote, benztropine.   DVT prophylaxis: lovenox Code Status: full code Family Communication: patient only Disposition Plan: back to group home, likely in am once cx complete  Consultants:   None  Procedures:   None  Antimicrobials:  Anti-infectives    Start     Dose/Rate Route Frequency Ordered Stop   06/22/16 1200  cefTRIAXone (ROCEPHIN) 1 g in dextrose 5 % 50 mL IVPB     1 g 100 mL/hr over 30 Minutes Intravenous Every 24 hours 06/21/16 1604     06/21/16 1200  cefTRIAXone (ROCEPHIN) 1 g in dextrose 5 % 50 mL IVPB     1 g 100 mL/hr over 30 Minutes Intravenous  Once 06/21/16 1148 06/21/16 1250       Subjective: Pleasant, cooperative, no complaints  Objective: Vitals:   06/21/16 1508 06/22/16 0720 06/22/16 1133 06/22/16 1300  BP: 128/78 114/84  (!) 125/95  Pulse: (!) 109 (!) 108  (!) 103  Resp: 18 18  18   Temp: 99.1 F (37.3 C) 98.9 F (37.2 C)  99.7 F (37.6 C)  TempSrc: Oral Axillary  Axillary  SpO2: 91% 95% 94% 96%  Weight: 62.1 kg (136 lb 14.5 oz)     Height:        Intake/Output Summary (Last 24 hours) at 06/22/16 1836 Last data filed at 06/22/16 1700  Gross per 24 hour  Intake          2201.25 ml  Output              376 ml  Net          1825.25 ml   Filed Weights   06/21/16 0859 06/21/16 1508  Weight: 68 kg (150 lb) 62.1 kg (136  lb 14.5 oz)    Examination:  General exam: Alert, awake,  Respiratory system: Clear to auscultation. Respiratory effort normal. Cardiovascular system:RRR. No murmurs, rubs, gallops. Gastrointestinal system: Abdomen is nondistended, soft and nontender. No organomegaly or masses felt. Normal bowel sounds heard. Central nervous system: Alert and oriented. No focal neurological deficits. Extremities: No C/C/E, +pedal pulses Skin: No rashes, lesions or ulcers Psychiatry:  Mood & affect appropriate.     Data Reviewed: I have personally reviewed following labs and imaging studies  CBC:  Recent Labs Lab 06/20/16 1845 06/21/16 0950 06/22/16 0441  WBC 10.8* 16.0* 11.3*  NEUTROABS 7.9* 11.3*  --   HGB 13.4 13.5 11.8*  HCT 39.0 40.3 35.6*  MCV 87.2 88.2 88.6  PLT 139* 142* 141*   Basic Metabolic Panel:  Recent Labs Lab 06/20/16 1845 06/21/16 1034 06/22/16 0441  NA 140 140 138  K 3.4* 3.8 3.2*  CL 104 105 105  CO2 27 27 25   GLUCOSE 162* 125* 96  BUN 23* 16 12  CREATININE 0.92 0.83 0.61  CALCIUM 9.1 8.4* 8.0*   GFR: Estimated Creatinine Clearance: 66 mL/min (by  C-G formula based on SCr of 0.61 mg/dL). Liver Function Tests:  Recent Labs Lab 06/20/16 1845  AST 14*  ALT 11*  ALKPHOS 65  BILITOT 0.7  PROT 6.6  ALBUMIN 3.2*   No results for input(s): LIPASE, AMYLASE in the last 168 hours. No results for input(s): AMMONIA in the last 168 hours. Coagulation Profile: No results for input(s): INR, PROTIME in the last 168 hours. Cardiac Enzymes:  Recent Labs Lab 06/20/16 1845  TROPONINI <0.03   BNP (last 3 results) No results for input(s): PROBNP in the last 8760 hours. HbA1C:  Recent Labs  06/21/16 1035  HGBA1C 6.0*   CBG:  Recent Labs Lab 06/21/16 1610 06/21/16 2119 06/22/16 0810 06/22/16 1203 06/22/16 1656  GLUCAP 116* 120* 82 99 88   Lipid Profile: No results for input(s): CHOL, HDL, LDLCALC, TRIG, CHOLHDL, LDLDIRECT in the last 72  hours. Thyroid Function Tests: No results for input(s): TSH, T4TOTAL, FREET4, T3FREE, THYROIDAB in the last 72 hours. Anemia Panel: No results for input(s): VITAMINB12, FOLATE, FERRITIN, TIBC, IRON, RETICCTPCT in the last 72 hours. Urine analysis:    Component Value Date/Time   COLORURINE YELLOW 06/21/2016 0937   APPEARANCEUR TURBID (A) 06/21/2016 0937   APPEARANCEUR Clear 08/31/2012 0455   LABSPEC 1.025 06/21/2016 0937   LABSPEC 1.007 08/31/2012 0455   PHURINE 7.0 06/21/2016 0937   GLUCOSEU NEGATIVE 06/21/2016 0937   GLUCOSEU >=500 08/31/2012 0455   HGBUR MODERATE (A) 06/21/2016 0937   BILIRUBINUR NEGATIVE 06/21/2016 0937   BILIRUBINUR Negative 08/31/2012 0455   KETONESUR 15 (A) 06/21/2016 0937   PROTEINUR 100 (A) 06/21/2016 0937   NITRITE NEGATIVE 06/21/2016 0937   LEUKOCYTESUR LARGE (A) 06/21/2016 0937   LEUKOCYTESUR Trace 08/31/2012 0455   Sepsis Labs: @LABRCNTIP (procalcitonin:4,lacticidven:4)  ) Recent Results (from the past 240 hour(s))  Urine culture     Status: Abnormal (Preliminary result)   Collection Time: 06/20/16  7:46 PM  Result Value Ref Range Status   Specimen Description URINE, CLEAN CATCH  Final   Special Requests NONE  Final   Culture (A)  Final    >=100,000 COLONIES/mL PROTEUS MIRABILIS SUSCEPTIBILITIES TO FOLLOW Performed at [facility], [address], [place], [place] [address]    Report Status PENDING  Incomplete  Blood culture (routine x 2)     Status: None (Preliminary result)   Collection Time: 06/21/16  9:58 AM  Result Value Ref Range Status   Specimen Description BLOOD BLOOD RIGHT FOREARM  Final   Special Requests   Final    BOTTLES DRAWN AEROBIC AND ANAEROBIC Blood Culture adequate volume   Culture NO GROWTH < 24 HOURS  Final   Report Status PENDING  Incomplete  Blood culture (routine x 2)     Status: None (Preliminary result)   Collection Time: 06/21/16 12:15 PM  Result Value Ref Range Status   Specimen Description BLOOD LEFT  ANTECUBITAL  Final   Special Requests   Final    BOTTLES DRAWN AEROBIC AND ANAEROBIC Blood Culture adequate volume   Culture NO GROWTH < 24 HOURS  Final   Report Status PENDING  Incomplete  MRSA PCR Screening     Status: None   Collection Time: 06/21/16  9:52 PM  Result Value Ref Range Status   MRSA by PCR NEGATIVE NEGATIVE Final    Comment:        The GeneXpert MRSA Assay (FDA approved for NASAL specimens only), is one component of a comprehensive MRSA colonization surveillance program. It is not  intended to diagnose MRSA infection nor to guide or monitor treatment for MRSA infections.          Radiology Studies: Dg Chest 2 View  Result Date: 06/20/2016 CLINICAL DATA:  Fever and altered mental status EXAM: CHEST  2 VIEW COMPARISON:  02/14/2015 FINDINGS: The heart size and mediastinal contours are within normal limits. Both lungs are clear. The visualized skeletal structures are unremarkable. IMPRESSION: No active cardiopulmonary disease. Electronically Signed   By: Alcide CleverMark  Lukens M.D.   On: 06/20/2016 18:49   Dg Chest Portable 1 View  Result Date: 06/21/2016 CLINICAL DATA:  Tachycardia. EXAM: PORTABLE CHEST 1 VIEW COMPARISON:  06/20/2016.  02/14/2015. FINDINGS: Mediastinum and hilar structures are unremarkable. Low lung volumes with mild basilar atelectasis. Right mid lung field pleuroparenchymal scarring again noted. No pleural effusion or pneumothorax. No acute bony abnormality . Surgical clips right chest. IMPRESSION: Low lung volumes with mild basilar atelectasis. Right mid lung field pleuroparenchymal scarring . Electronically Signed   By: Maisie Fushomas  Register   On: 06/21/2016 09:57        Scheduled Meds: . atorvastatin  10 mg Oral QPM  . benztropine  1 mg Oral BID  . cholecalciferol  1,000 Units Oral Daily  . divalproex  500 mg Oral BID  . docusate sodium  100 mg Oral BID  . enoxaparin (LOVENOX) injection  40 mg Subcutaneous Q24H  . folic acid  0.5 mg Oral Daily  .  haloperidol  10 mg Oral BID  . insulin aspart  0-5 Units Subcutaneous QHS  . insulin aspart  0-9 Units Subcutaneous TID WC  . insulin aspart  3 Units Subcutaneous TID WC  . levothyroxine  50 mcg Oral QAC breakfast  . multivitamin with minerals  1 tablet Oral Daily  . OLANZapine  20 mg Oral Daily  . oxybutynin  5 mg Oral Daily  . pantoprazole  40 mg Oral Daily  . traZODone  50 mg Oral Daily   And  . traZODone  25 mg Oral QHS   Continuous Infusions: . sodium chloride 75 mL/hr at 06/21/16 1643  . cefTRIAXone (ROCEPHIN)  IV Stopped (06/22/16 1203)     LOS: 1 day    Time spent: 25 minutes. Greater than 50% of this time was spent in direct contact with the patient coordinating care.     Chaya JanHERNANDEZ ACOSTA,ESTELA, MD [facility] Pager [phone]  If 7PM-7AM, please contact night-coverage [email or web address] Password [email or web address] 06/22/2016, 6:36 PM

## 2016-06-22 NOTE — Clinical Social Work Note (Signed)
Clinical Social Work Assessment  Patient Details  Name: Tina Ellison MRN: [id number] Date of Birth: 1946/01/28  Date of referral:                  Reason for consult:  Discharge Planning, Community Resources                Permission sought to share information with:  Case Production designer, theatre/television/filmManager, Magazine features editoracility Contact Representative, Guardian Permission granted to share information::  Yes, Verbal Permission Granted  Name::        Agency::  [facility]  Relationship::  Guardian:  [facility] [place]  [phone]  Contact Information:     Housing/Transportation Living arrangements for the past 2 months:  Group Home Source of Information:  Medical Team, Case Manager, Guardian, Facility Patient Interpreter Needed:  None Criminal Activity/Legal Involvement Pertinent to Current Situation/Hospitalization:  No - Comment as needed Significant Relationships:  Merchandiser, retailCommunity Support Lives with:  Facility Resident Do you feel safe going back to the place where you live?  Yes Need for family participation in patient care:  Yes (Comment)  Care giving concerns:  Facility reports they have just noticed she is not at baseline AEB "talking out of her head, confused, lethargic, and reports they were concerned, thus brought her in".  Patient has an extensive psych history, but facility reports she has been stable on her medications with her ACT team and no behaviors of aggression or issues noted. She is welcomed to return back to group home when medically stable. There is no family involved.  She is a ward of the state.   Social Worker assessment / plan:  LCSW completed assessment and contacted APS regarding status of patient and updates. Message left with Tina Ellison (who is out of the office until Monday) and her supervisor Tina Ellison([name] and the head supervisor: Tina Ellison:  [phone]). Spoke with facility who is in agreement to accept patient back. Will need a FL2 at discharge and most likely can come pick patient up  when ready.  No other needs currently.  Will follow acute admission and assist with DC planning.  Employment status:  Disabled (Comment on whether or not currently receiving Disability) Insurance information:  Medicare, Medicaid [place] [employer] PT Recommendations:  Not assessed at this time Information / Referral to community resources:     Patient/Family's Response to care:  Still assessing, GH is agreeable for return  Patient/Family's Understanding of and Emotional Response to Diagnosis, Current Treatment, and Prognosis:  Still assessing, patient confused at this time, sleeping. No contact with family and message left for APS.  Emotional Assessment Appearance:  Appears stated age Attitude/Demeanor/Rapport:    Affect (typically observed):  Accepting Orientation:  Oriented to Self, Oriented to Place Alcohol / Substance use:  Not Applicable Psych involvement (Current and /or in the community):  Yes (Comment), [facility]ACT [facility]  Discharge Needs  Concerns to be addressed:  No discharge needs identified Readmission within the last 30 days:  Yes Current discharge risk:  None Barriers to Discharge:  No Barriers Identified, Continued Medical Work up   Raye SorrowCoble, Alvah Lagrow N, LCSW 06/22/2016, 9:26 AM

## 2016-06-23 LAB — URINE CULTURE

## 2016-06-23 LAB — GLUCOSE, CAPILLARY
GLUCOSE-CAPILLARY: 92 mg/dL (ref 65–99)
Glucose-Capillary: 160 mg/dL — ABNORMAL HIGH (ref 65–99)

## 2016-06-23 LAB — CBC
HEMATOCRIT: 34 % — AB (ref 36.0–46.0)
HEMOGLOBIN: 11.7 g/dL — AB (ref 12.0–15.0)
MCH: 30 pg (ref 26.0–34.0)
MCHC: 34.4 g/dL (ref 30.0–36.0)
MCV: 87.2 fL (ref 78.0–100.0)
Platelets: 151 10*3/uL (ref 150–400)
RBC: 3.9 MIL/uL (ref 3.87–5.11)
RDW: 13.8 % (ref 11.5–15.5)
WBC: 9.6 10*3/uL (ref 4.0–10.5)

## 2016-06-23 MED ORDER — CIPROFLOXACIN HCL 500 MG PO TABS: 500.0000 mg | ORAL_TABLET | Freq: Two times a day (BID) | ORAL | 0 refills | Status: AC

## 2016-06-23 MED ORDER — CIPROFLOXACIN HCL 250 MG PO TABS
500.0000 mg | ORAL_TABLET | Freq: Two times a day (BID) | ORAL | Status: DC
  Filled 2016-06-23: qty 2

## 2016-06-23 NOTE — NC FL2 (Signed)
[place] MEDICAID FL2 LEVEL OF CARE SCREENING TOOL     IDENTIFICATION  Patient Name: Tina Ellison Birthdate: [birth year] Sex: female Admission Date (Current Location): 06/21/2016  [facility] and [place] Number:  [employer] and Address:  [facility],  [address] [place] [address]      Provider Number: [id number]  Attending Physician Name and Address:  Philip Aspen, Minerva Ends*  Relative Name and Phone Number:       Current Level of Care: Hospital Recommended Level of Care: Family Care Home Prior Approval Number:    Date Approved/Denied:   PASRR Number:    Discharge Plan: Domiciliary (Rest home)    Current Diagnoses: Patient Active Problem List   Diagnosis Date Noted  . UTI (urinary tract infection) 06/21/2016  . Disorganized schizophrenia (HCC)   . Schizophrenia (HCC) 07/07/2014  . Diabetes (HCC) 07/07/2014    Orientation RESPIRATION BLADDER Height & Weight     Self, Place  Normal Incontinent (Occassionally) Weight: 136 lb 14.5 oz (62.1 kg) Height:  5\' 8"  (172.7 cm)  BEHAVIORAL SYMPTOMS/MOOD NEUROLOGICAL BOWEL NUTRITION STATUS      Continent Diet (Heart healthy/carb modified)  AMBULATORY STATUS COMMUNICATION OF NEEDS Skin   Limited Assist Verbally Normal                       Personal Care Assistance Level of Assistance  Bathing, Feeding, Dressing Bathing Assistance: Limited assistance Feeding assistance: Independent Dressing Assistance: Limited assistance     Functional Limitations Info  Sight, Hearing, Speech Sight Info: Adequate Hearing Info: Adequate Speech Info: Adequate (Slurred/dysarthria)    SPECIAL CARE FACTORS FREQUENCY                       Contractures Contractures Info: Not present    Additional Factors Info  Code Status, Allergies, Psychotropic, Insulin Sliding Scale Code Status Info: Full Allergies Info: Haldol Haloperidol Psychotropic Info: See med list Insulin Sliding Scale Info: See  med list       Current Medications (06/23/2016):  This is the current hospital active medication list Current Facility-Administered Medications  Medication Dose Route Frequency Provider Last Rate Last Dose  . 0.9 %  sodium chloride infusion   Intravenous Continuous Philip Aspen, Limmie Patricia, MD 75 mL/hr at 06/23/16 1023    . acetaminophen (TYLENOL) tablet 650 mg  650 mg Oral Q6H PRN Philip Aspen, Limmie Patricia, MD       Or  . acetaminophen (TYLENOL) suppository 650 mg  650 mg Rectal Q6H PRN Philip Aspen, Limmie Patricia, MD      . atorvastatin (LIPITOR) tablet 10 mg  10 mg Oral QPM Philip Aspen, Limmie Patricia, MD   10 mg at 06/21/16 1640  . benztropine (COGENTIN) tablet 1 mg  1 mg Oral BID Philip Aspen, Limmie Patricia, MD   1 mg at 06/22/16 2112  . cefTRIAXone (ROCEPHIN) 1 g in dextrose 5 % 50 mL IVPB  1 g Intravenous Q24H Philip Aspen, Limmie Patricia, MD   Stopped at 06/23/16 1335  . cholecalciferol (VITAMIN D) tablet 1,000 Units  1,000 Units Oral Daily Philip Aspen, Limmie Patricia, MD   1,000 Units at 06/23/16 1010  . divalproex (DEPAKOTE ER) 24 hr tablet 500 mg  500 mg Oral BID Philip Aspen, Limmie Patricia, MD   500 mg at 06/23/16 1007  . docusate sodium (COLACE) capsule 100 mg  100 mg Oral BID Philip Aspen, Limmie Patricia, MD   100 mg at  06/23/16 1007  . enoxaparin (LOVENOX) injection 40 mg  40 mg Subcutaneous Q24H Philip AspenHernandez Acosta, Limmie PatriciaEstela Y, MD   40 mg at 06/21/16 1640  . folic acid (FOLVITE) tablet 0.5 mg  0.5 mg Oral Daily Philip AspenHernandez Acosta, Limmie PatriciaEstela Y, MD   0.5 mg at 06/23/16 1009  . haloperidol (HALDOL) tablet 10 mg  10 mg Oral BID Philip AspenHernandez Acosta, Limmie PatriciaEstela Y, MD   10 mg at 06/22/16 2114  . insulin aspart (novoLOG) injection 0-5 Units  0-5 Units Subcutaneous QHS Philip AspenHernandez Acosta, Limmie PatriciaEstela Y, MD      . insulin aspart (novoLOG) injection 0-9 Units  0-9 Units Subcutaneous TID WC Philip AspenHernandez Acosta, Limmie PatriciaEstela Y, MD      . insulin aspart (novoLOG) injection 3 Units  3 Units Subcutaneous TID WC Philip AspenHernandez Acosta,  Limmie PatriciaEstela Y, MD   3 Units at 06/23/16 1010  . levothyroxine (SYNTHROID, LEVOTHROID) tablet 50 mcg  50 mcg Oral QAC breakfast Philip AspenHernandez Acosta, Limmie PatriciaEstela Y, MD   50 mcg at 06/23/16 1010  . multivitamin with minerals tablet 1 tablet  1 tablet Oral Daily Philip AspenHernandez Acosta, Limmie PatriciaEstela Y, MD   1 tablet at 06/23/16 1010  . OLANZapine (ZYPREXA) tablet 20 mg  20 mg Oral Daily Philip AspenHernandez Acosta, Limmie PatriciaEstela Y, MD   20 mg at 06/23/16 1007  . ondansetron (ZOFRAN) tablet 4 mg  4 mg Oral Q6H PRN Philip AspenHernandez Acosta, Limmie PatriciaEstela Y, MD       Or  . ondansetron [facility](ZOFRAN) injection 4 mg  4 mg Intravenous Q6H PRN Philip AspenHernandez Acosta, Limmie PatriciaEstela Y, MD      . oxybutynin (DITROPAN) tablet 5 mg  5 mg Oral Daily Philip AspenHernandez Acosta, Limmie PatriciaEstela Y, MD   5 mg at 06/23/16 1010  . pantoprazole (PROTONIX) EC tablet 40 mg  40 mg Oral Daily Philip AspenHernandez Acosta, Limmie PatriciaEstela Y, MD   40 mg at 06/23/16 1009  . senna-docusate (Senokot-S) tablet 1 tablet  1 tablet Oral QHS PRN Philip AspenHernandez Acosta, Limmie PatriciaEstela Y, MD      . traZODone (DESYREL) tablet 50 mg  50 mg Oral Daily Philip AspenHernandez Acosta, Limmie PatriciaEstela Y, MD   50 mg at 06/22/16 0802   And  . traZODone (DESYREL) tablet 25 mg  25 mg Oral QHS Philip AspenHernandez Acosta, Limmie PatriciaEstela Y, MD   25 mg at 06/22/16 2113     Discharge Medications: Please see discharge summary for a list of discharge medications.  Relevant Imaging Results:  Relevant Lab Results:   Additional Information SSN: [id number]  Dominic PeaJeneya G Aryani Daffern, LCSW

## 2016-06-23 NOTE — Discharge Summary (Signed)
Physician Discharge Summary  Tina Rougemily Jane Mckeel GNF:[id number] DOB: 20-Dec-1945 DOA: 06/21/2016  PCP: Duffy RhodyPartridge, Tanillya, FNP  Admit date: 06/21/2016 Discharge date: 06/23/2016  Time spent: 45 minutes  Recommendations for Outpatient Follow-up:  -Will be discharged back to her group home today.   Discharge Diagnoses:  Principal Problem:   UTI (urinary tract infection) Active Problems:   Schizophrenia (HCC)   Diabetes (HCC)   Discharge Condition: Stable and improved  Filed Weights   06/21/16 0859 06/21/16 1508  Weight: 68 kg (150 lb) 62.1 kg (136 lb 14.5 oz)    History of present illness:  Tina Ellison is a 71 y.o. female brought in from her SNF due to acute encephalopathy. She was seen in the ED yesterday given IVF and rocephin and sent back to SNF with PO abx that she was apparently unable to take due to lethargy prompting her return visit today when she spiked a reported temp. EDP reports urine sample was frankly purulent and very malodorous. By the time I see her, she is awake and alert, tells me she feels uncomfortable because she is wet (she is incontinent). Admission requested.  Hospital Course:   UTI -Cx with proteus, will DC on cipro for 5 more days. -Has been afebrile since admission.  Acute Encephalopathy -Seems improved. -Likely due to UTI on top of her mental illness.  Schizophrenia -mood stable. -Continue zyprexa, haldol, depakote, benztropine.  Procedures:  None   Consultations:  None  Discharge Instructions  Discharge Instructions    Diet - low sodium heart healthy    Complete by:  As directed    Increase activity slowly    Complete by:  As directed      Allergies as of 06/23/2016      Reactions   Haldol [haloperidol]    Patient reported-states dry mouth. No allergies are listed on MAR      Medication List    STOP taking these medications   cephALEXin 500 MG capsule Commonly known as:  KEFLEX     TAKE these medications     alendronate 70 MG tablet Commonly known as:  FOSAMAX Take 70 mg by mouth once a week. Take with a full glass of water on an empty stomach. Every Monday.   atorvastatin 10 MG tablet Commonly known as:  LIPITOR Take 10 mg by mouth every evening.   benztropine 1 MG tablet Commonly known as:  COGENTIN Take 1 mg by mouth 2 (two) times daily.   cholecalciferol 1000 units tablet Commonly known as:  VITAMIN D Take 1,000 Units by mouth daily.   ciprofloxacin 500 MG tablet Commonly known as:  CIPRO Take 1 tablet (500 mg total) by mouth 2 (two) times daily.   divalproex 500 MG 24 hr tablet Commonly known as:  DEPAKOTE ER Take 500 mg by mouth 2 (two) times daily.   docusate sodium 100 MG capsule Commonly known as:  COLACE Take 100 mg by mouth 2 (two) times daily.   folic acid 400 MCG tablet Commonly known as:  FOLVITE Take 400 mcg by mouth daily.   haloperidol 10 MG tablet Commonly known as:  HALDOL Take 10 mg by mouth 2 (two) times daily.   levothyroxine 50 MCG tablet Commonly known as:  SYNTHROID, LEVOTHROID Take 50 mcg by mouth daily before breakfast.   Melatonin 3 MG Tabs Take 3 mg by mouth at bedtime.   multivitamin with minerals Tabs tablet Take 1 tablet by mouth daily.   OLANZapine 20 MG tablet  Commonly known as:  ZYPREXA Take 20 mg by mouth daily.   omeprazole 20 MG capsule Commonly known as:  PRILOSEC Take 20 mg by mouth daily.   oxybutynin 5 MG tablet Commonly known as:  DITROPAN Take 5 mg by mouth daily.   traZODone 50 MG tablet Commonly known as:  DESYREL Take 25-50 mg by mouth 2 (two) times daily. *Take one tablet daily in the morning and one-half tablet daily at bedtime      Allergies  Allergen Reactions  . Haldol [Haloperidol]     Patient reported-states dry mouth. No allergies are listed on MAR   Follow-up Information    Duffy Rhody, FNP. Schedule an appointment as soon as possible for a visit in 2 week(s).   Specialty:  Family  Medicine Contact information: [address] [place] [address] [phone]            The results of significant diagnostics from this hospitalization (including imaging, microbiology, ancillary and laboratory) are listed below for reference.    Significant Diagnostic Studies: Dg Chest 2 View  Result Date: 06/20/2016 CLINICAL DATA:  Fever and altered mental status EXAM: CHEST  2 VIEW COMPARISON:  02/14/2015 FINDINGS: The heart size and mediastinal contours are within normal limits. Both lungs are clear. The visualized skeletal structures are unremarkable. IMPRESSION: No active cardiopulmonary disease. Electronically Signed   By: Alcide Clever M.D.   On: 06/20/2016 18:49   Dg Chest Portable 1 View  Result Date: 06/21/2016 CLINICAL DATA:  Tachycardia. EXAM: PORTABLE CHEST 1 VIEW COMPARISON:  06/20/2016.  02/14/2015. FINDINGS: Mediastinum and hilar structures are unremarkable. Low lung volumes with mild basilar atelectasis. Right mid lung field pleuroparenchymal scarring again noted. No pleural effusion or pneumothorax. No acute bony abnormality . Surgical clips right chest. IMPRESSION: Low lung volumes with mild basilar atelectasis. Right mid lung field pleuroparenchymal scarring . Electronically Signed   By: Maisie Fus  Register   On: 06/21/2016 09:57    Microbiology: Recent Results (from the past 240 hour(s))  Urine culture     Status: Abnormal (Preliminary result)   Collection Time: 06/20/16  7:46 PM  Result Value Ref Range Status   Specimen Description URINE, CLEAN CATCH  Final   Special Requests NONE  Final   Culture (A)  Final    >=100,000 COLONIES/mL PROTEUS MIRABILIS SUSCEPTIBILITIES TO FOLLOW Performed at [facility], [address], [place], [place] [address]    Report Status PENDING  Incomplete  Blood culture (routine x 2)     Status: None (Preliminary result)   Collection Time: 06/21/16  9:58 AM  Result Value Ref Range Status   Specimen  Description BLOOD BLOOD RIGHT FOREARM  Final   Special Requests   Final    BOTTLES DRAWN AEROBIC AND ANAEROBIC Blood Culture adequate volume   Culture NO GROWTH 2 DAYS  Final   Report Status PENDING  Incomplete  Blood culture (routine x 2)     Status: None (Preliminary result)   Collection Time: 06/21/16 12:15 PM  Result Value Ref Range Status   Specimen Description BLOOD LEFT ANTECUBITAL  Final   Special Requests   Final    BOTTLES DRAWN AEROBIC AND ANAEROBIC Blood Culture adequate volume   Culture NO GROWTH 2 DAYS  Final   Report Status PENDING  Incomplete  Urine culture     Status: Abnormal   Collection Time: 06/21/16  4:04 PM  Result Value Ref Range Status   Specimen Description URINE, CLEAN CATCH  Final   Special Requests NONE  Final   Culture (A)  Final    <10,000 COLONIES/mL INSIGNIFICANT GROWTH Performed at [facility], [address] [place], [place] [address]    Report Status 06/23/2016 FINAL  Final  MRSA PCR Screening     Status: None   Collection Time: 06/21/16  9:52 PM  Result Value Ref Range Status   MRSA by PCR NEGATIVE NEGATIVE Final    Comment:        The GeneXpert MRSA Assay (FDA approved for NASAL specimens only), is one component of a comprehensive MRSA colonization surveillance program. It is not intended to diagnose MRSA infection nor to guide or monitor treatment for MRSA infections.      Labs: Basic Metabolic Panel:  Recent Labs Lab 06/20/16 1845 06/21/16 1034 06/22/16 0441  NA 140 140 138  K 3.4* 3.8 3.2*  CL 104 105 105  CO2 27 27 25   GLUCOSE 162* 125* 96  BUN 23* 16 12  CREATININE 0.92 0.83 0.61  CALCIUM 9.1 8.4* 8.0*   Liver Function Tests:  Recent Labs Lab 06/20/16 1845  AST 14*  ALT 11*  ALKPHOS 65  BILITOT 0.7  PROT 6.6  ALBUMIN 3.2*   No results for input(s): LIPASE, AMYLASE in the last 168 hours. No results for input(s): AMMONIA in the last 168 hours. CBC:  Recent Labs Lab 06/20/16 1845 06/21/16 0950  06/22/16 0441 06/23/16 0738  WBC 10.8* 16.0* 11.3* 9.6  NEUTROABS 7.9* 11.3*  --   --   HGB 13.4 13.5 11.8* 11.7*  HCT 39.0 40.3 35.6* 34.0*  MCV 87.2 88.2 88.6 87.2  PLT 139* 142* 141* 151   Cardiac Enzymes:  Recent Labs Lab 06/20/16 1845  TROPONINI <0.03   BNP: BNP (last 3 results) No results for input(s): BNP in the last 8760 hours.  ProBNP (last 3 results) No results for input(s): PROBNP in the last 8760 hours.  CBG:  Recent Labs Lab 06/22/16 1203 06/22/16 1656 06/22/16 2052 06/23/16 0733 06/23/16 1128  GLUCAP 99 88 123* 92 160*       Signed:  HERNANDEZ ACOSTA,ESTELA  [facility] Pager: [phone] 06/23/2016, 3:45 PM

## 2016-06-23 NOTE — Progress Notes (Signed)
Patient discharging back to group home.  IV removed - WNL.  Reviewed DC instructions and medications with care taker.  Educated on importance of proper peri care to prevent further UTI and on importance of completing dose of Abx. Verbalized understanding.  Instructed to follow up with PCP in 2 weeks.  No questions at this time, patient assisted off unit in NAD.

## 2016-06-23 NOTE — Clinical Social Work Note (Signed)
Pt ready for d/c today back to [facility]. [facility] stated [name] is provided transportation for pt. CSW left VM for the guardianship SW supervisor-Kiley at [phone] and the head supervisor-Latonya:  [phone]. CSW completed FL-2. RN Megan aware of d/c plan and will provide [name] with FL-2 and d/c summary. CSW signing off as no further SW needs identified.   Corlis HoveJeneya Artis Buechele, LCSWA, LCASA Weekend Social Work [phone]

## 2016-06-24 LAB — URINE CULTURE

## 2016-06-25 ENCOUNTER — Telehealth: Payer: Self-pay | Admitting: Emergency Medicine

## 2016-06-25 NOTE — Telephone Encounter (Signed)
Post ED Visit - Positive Culture Follow-up  Culture report reviewed by antimicrobial stewardship pharmacist:  []  Enzo BiNathan Batchelder, Pharm.D. [x]  Celedonio MiyamotoJeremy Frens, Pharm.D., BCPS AQ-ID []  Garvin FilaMike Maccia, Pharm.D., BCPS []  Georgina PillionElizabeth Martin, Pharm.D., BCPS []  MontroseMinh Pham, [address]Pharm.D., BCPS, AAHIVP []  Estella HuskMichelle Turner, Pharm.D., BCPS, AAHIVP []  Lysle Pearlachel Rumbarger, PharmD, BCPS []  Casilda Carlsaylor Stone, PharmD, BCPS []  Pollyann SamplesAndy Johnston, PharmD, BCPS  Positive urine culture Treated with cephalexin, organism sensitive to the same and no further patient follow-up is required at this time.  Berle MullMiller, Rhiann Boucher 06/25/2016, 1:12 PM

## 2016-06-26 LAB — CULTURE, BLOOD (ROUTINE X 2)
CULTURE: NO GROWTH
CULTURE: NO GROWTH
Special Requests: ADEQUATE
Special Requests: ADEQUATE

## 2016-10-01 ENCOUNTER — Ambulatory Visit: Payer: Medicare Other | Admitting: Podiatry

## 2016-10-12 ENCOUNTER — Encounter (HOSPITAL_COMMUNITY): Payer: Self-pay

## 2016-10-12 ENCOUNTER — Emergency Department (HOSPITAL_COMMUNITY)
Admission: EM | Admit: 2016-10-12 | Discharge: 2016-10-12 | Disposition: A | Discharge status: Home / Self Care | Payer: Medicare Other | Attending: Emergency Medicine | Admitting: Emergency Medicine

## 2016-10-12 ENCOUNTER — Emergency Department (HOSPITAL_COMMUNITY): Payer: Medicare Other

## 2016-10-12 DIAGNOSIS — Y999 Unspecified external cause status: Secondary | ICD-10-CM | POA: Diagnosis not present

## 2016-10-12 DIAGNOSIS — W01190A Fall on same level from slipping, tripping and stumbling with subsequent striking against furniture, initial encounter: Secondary | ICD-10-CM | POA: Diagnosis not present

## 2016-10-12 DIAGNOSIS — Y939 Activity, unspecified: Secondary | ICD-10-CM | POA: Diagnosis not present

## 2016-10-12 DIAGNOSIS — S2020XA Contusion of thorax, unspecified, initial encounter: Secondary | ICD-10-CM | POA: Insufficient documentation

## 2016-10-12 DIAGNOSIS — F17210 Nicotine dependence, cigarettes, uncomplicated: Secondary | ICD-10-CM | POA: Insufficient documentation

## 2016-10-12 DIAGNOSIS — E039 Hypothyroidism, unspecified: Secondary | ICD-10-CM | POA: Insufficient documentation

## 2016-10-12 DIAGNOSIS — E119 Type 2 diabetes mellitus without complications: Secondary | ICD-10-CM | POA: Diagnosis not present

## 2016-10-12 DIAGNOSIS — Z79899 Other long term (current) drug therapy: Secondary | ICD-10-CM | POA: Diagnosis not present

## 2016-10-12 DIAGNOSIS — S0990XA Unspecified injury of head, initial encounter: Secondary | ICD-10-CM | POA: Diagnosis present

## 2016-10-12 DIAGNOSIS — Y929 Unspecified place or not applicable: Secondary | ICD-10-CM | POA: Insufficient documentation

## 2016-10-12 DIAGNOSIS — J449 Chronic obstructive pulmonary disease, unspecified: Secondary | ICD-10-CM | POA: Insufficient documentation

## 2016-10-12 DIAGNOSIS — W19XXXA Unspecified fall, initial encounter: Secondary | ICD-10-CM

## 2016-10-12 HISTORY — DX: Vitamin D deficiency, unspecified: E55.9

## 2016-10-12 LAB — I-STAT CHEM 8, ED
BUN: 16 mg/dL (ref 6–20)
CHLORIDE: 100 mmol/L — AB (ref 101–111)
Calcium, Ion: 1.18 mmol/L (ref 1.15–1.40)
Creatinine, Ser: 0.8 mg/dL (ref 0.44–1.00)
GLUCOSE: 100 mg/dL — AB (ref 65–99)
HCT: 34 % — ABNORMAL LOW (ref 36.0–46.0)
Hemoglobin: 11.6 g/dL — ABNORMAL LOW (ref 12.0–15.0)
Potassium: 4 mmol/L (ref 3.5–5.1)
Sodium: 140 mmol/L (ref 135–145)
TCO2: 30 mmol/L (ref 22–32)

## 2016-10-12 LAB — I-STAT TROPONIN, ED: Troponin i, poc: 0 ng/mL (ref 0.00–0.08)

## 2016-10-12 NOTE — ED Notes (Signed)
pt taken to xray 

## 2016-10-12 NOTE — Discharge Instructions (Signed)
Take tylenol for pain and follow up if needed

## 2016-10-12 NOTE — ED Triage Notes (Signed)
Pt says she fell because she was feeling weak all over and was trying to make it to a chair.  Pt says left chest is sore to touch.  Pt says she thinks hit her chest when she fell.  Pt also 86% on room air.  02 sat increased to 90% on 2 liters.  Pt denies any sob.

## 2016-10-12 NOTE — ED Notes (Signed)
[facility] aware pt is ready for d/c. Pt has been resting. No complaints. Nad.

## 2016-10-12 NOTE — ED Triage Notes (Signed)
Pt resident of [facility].  Pt has unwitnessed fall today and hit head on table.  No loss of consciousness.  C/o chest feeling sore after the fall.  Pt alert and oriented to self.  Reports is schizophrenic.  Per EMS, pt's mental status is at baseline.  EMS placed c collar on pt because she was c/o neck pain.

## 2016-10-12 NOTE — ED Provider Notes (Signed)
[facility]-EMER[facility]ENCY DEPT Provider Note   CSN: [id number] Arrival date & time: 10/12/16  1256     History   Chief Complaint Chief Complaint  Patient presents with  . Fall    HPI Tina Ellison is a 71 y.o. female.  Patient fell and hit her head no loss of consciousness. Patient complains of contusion to chest   The history is provided by the patient, the nursing home and the EMS personnel. No language interpreter was used.  Fall  This is a new problem. The current episode started 1 to 2 hours ago. The problem occurs rarely. The problem has been resolved. Associated symptoms include chest pain. Pertinent negatives include no abdominal pain and no headaches. Nothing aggravates the symptoms. Nothing relieves the symptoms. She has tried nothing for the symptoms. The treatment provided no relief.    Past Medical History:  Diagnosis Date  . Anxiety   . Anxiety disorder   . COPD (chronic obstructive pulmonary disease) (HCC)   . Depression   . GERD (gastroesophageal reflux disease)   . Hypothyroidism   . Memory loss   . Osteoporosis   . Overactive bladder   . Schizoaffective disorder (HCC)   . Schizophrenia (HCC)   . Thrombocytopenia (HCC)   . Urinary retention   . UTI (lower urinary tract infection)   . Vitamin D deficiency   . Vitamin deficiency   . Weakness     Patient Active Problem List   Diagnosis Date Noted  . UTI (urinary tract infection) 06/21/2016  . Disorganized schizophrenia (HCC)   . Schizophrenia (HCC) 07/07/2014  . Diabetes (HCC) 07/07/2014    Past Surgical History:  Procedure Laterality Date  . BREAST SURGERY Right     OB History    No data available       Home Medications    Prior to Admission medications   Medication Sig Start Date End Date Taking? Authorizing Provider  atorvastatin (LIPITOR) 10 MG tablet Take 10 mg by mouth every evening.   Yes [provider]  benztropine (COGENTIN) 1 MG tablet Take 1 mg by mouth 2 (two) times  daily.   Yes [provider]  cholecalciferol (VITAMIN D) 1000 units tablet Take 1,000 Units by mouth daily.   Yes [provider]  divalproex (DEPAKOTE ER) 500 MG 24 hr tablet Take 500 mg by mouth 2 (two) times daily.    Yes [provider]  docusate sodium (COLACE) 100 MG capsule Take 100 mg by mouth 2 (two) times daily.   Yes [provider]  folic acid (FOLVITE) 400 MCG tablet Take 400 mcg by mouth daily.   Yes [provider]  haloperidol (HALDOL) 10 MG tablet Take 10 mg by mouth 2 (two) times daily.   Yes [provider]  levothyroxine (SYNTHROID, LEVOTHROID) 50 MCG tablet Take 50 mcg by mouth daily before breakfast.   Yes [provider]  Melatonin 3 MG TABS Take 3 mg by mouth at bedtime.   Yes [provider]  Multiple Vitamin (MULTIVITAMIN WITH MINERALS) TABS tablet Take 1 tablet by mouth daily.   Yes [provider]  OLANZapine (ZYPREXA) 20 MG tablet Take 20 mg by mouth daily.   Yes [provider]  omeprazole (PRILOSEC) 20 MG capsule Take 20 mg by mouth daily.   Yes [provider]  oxybutynin (DITROPAN) 5 MG tablet Take 5 mg by mouth daily.   Yes [provider]  traZODone (DESYREL) 50 MG tablet Take 25-50 mg  by mouth 2 (two) times daily. *Take one tablet daily in the morning and one-half tablet daily at bedtime   Yes [provider]  alendronate (FOSAMAX) 70 MG tablet Take 70 mg by mouth once a week. Take with a full glass of water on an empty stomach. Every Monday.    [provider]    Family History No family history on file.  Social History Social History  Substance Use Topics  . Smoking status: Current Every Day Smoker    Packs/day: 0.50    Types: Cigarettes  . Smokeless tobacco: Never Used  . Alcohol use No     Allergies   Haldol [haloperidol]   Review of Systems Review of Systems  Constitutional: Negative for appetite change and fatigue.   HENT: Negative for congestion, ear discharge and sinus pressure.   Eyes: Negative for discharge.  Respiratory: Negative for cough.   Cardiovascular: Positive for chest pain.  Gastrointestinal: Negative for abdominal pain and diarrhea.  Genitourinary: Negative for frequency and hematuria.  Musculoskeletal: Negative for back pain.  Skin: Negative for rash.  Neurological: Negative for seizures and headaches.  Psychiatric/Behavioral: Negative for hallucinations.     Physical Exam Updated Vital Signs BP (!) 106/56   Pulse 98   Temp 98 F (36.7 C) (Oral)   Resp 19   SpO2 95%   Physical Exam  Constitutional: She appears well-developed.  HENT:  Head: Normocephalic.  Eyes: Conjunctivae and EOM are normal. No scleral icterus.  Neck: Neck supple. No thyromegaly present.  Cardiovascular: Normal rate and regular rhythm.  Exam reveals no gallop and no friction rub.   No murmur heard. Pulmonary/Chest: No stridor. She has no wheezes. She has no rales. She exhibits tenderness.  Abdominal: She exhibits no distension. There is no tenderness. There is no rebound.  Musculoskeletal: Normal range of motion. She exhibits no edema.  Lymphadenopathy:    She has no cervical adenopathy.  Neurological: She is alert. She exhibits normal muscle tone. Coordination normal.  Patient has severe dementia and is oriented to only person  Skin: No rash noted. No erythema.  Psychiatric: She has a normal mood and affect. Her behavior is normal.     ED Treatments / Results  Labs (all labs ordered are listed, but only abnormal results are displayed) Labs Reviewed  I-STAT CHEM 8, ED - Abnormal; Notable for the following:       Result Value   Chloride 100 (*)    Glucose, Bld 100 (*)    Hemoglobin 11.6 (*)    HCT 34.0 (*)    All other components within normal limits  I-STAT TROPONIN, ED    EKG  EKG Interpretation  Date/Time:  Friday October 12 2016 13:05:30 EDT Ventricular Rate:  98 PR Interval:      QRS Duration: 86 QT Interval:  360 QTC Calculation: 460 R Axis:   60 Text Interpretation:  Atrial flutter with predominant 3:1 AV block Low voltage, precordial leads Nonspecific T abnormalities, lateral leads Confirmed by Bethann Berkshire [phone]) on 10/12/2016 2:24:35 PM       Radiology Dg Chest 2 View  Result Date: 10/12/2016 CLINICAL DATA:  Status post fall today.  History of COPD. EXAM: CHEST  2 VIEW COMPARISON:  PA and lateral chest 06/20/2016. FINDINGS: The lungs are clear. The patient is status post right mastectomy and axillary dissection. No pneumothorax or pleural effusion. No acute bony abnormality. IMPRESSION: No acute disease. Electronically Signed   By: Drusilla Kanner M.D.   On:  10/12/2016 13:55   Ct Head Wo Contrast  Result Date: 10/12/2016 CLINICAL DATA:  Status post fall today with a blow to the head. Initial encounter. EXAM: CT HEAD WITHOUT CONTRAST CT CERVICAL SPINE WITHOUT CONTRAST TECHNIQUE: Multidetector CT imaging of the head and cervical spine was performed following the standard protocol without intravenous contrast. Multiplanar CT image reconstructions of the cervical spine were also generated. COMPARISON:  Head and cervical spine CT scans 02/14/2015. FINDINGS: CT HEAD FINDINGS Brain: There is some cortical atrophy and chronic microvascular ischemic change. No evidence of acute abnormality including hemorrhage, infarct, mass lesion, mass effect, midline shift or abnormal extra-axial fluid collection is identified. Vascular: No hyperdense vessel or unexpected calcification. Skull: Intact. Sinuses/Orbits: Negative. Other: None. CT CERVICAL SPINE FINDINGS Alignment: Maintained. Skull base and vertebrae: No acute fracture. No primary bone lesion or focal pathologic process. Soft tissues and spinal canal: No prevertebral fluid or swelling. No visible canal hematoma. Disc levels: Mild loss of disc space height is seen at C3-4. Scattered facet degenerative change appears worst at  C2-3 and C3-4. Upper chest: Emphysematous changes identified. Other: None. IMPRESSION: No acute abnormality head or cervical spine. Emphysema. Electronically Signed   By: Drusilla Kanner M.D.   On: 10/12/2016 13:54   Ct Cervical Spine Wo Contrast  Result Date: 10/12/2016 CLINICAL DATA:  Status post fall today with a blow to the head. Initial encounter. EXAM: CT HEAD WITHOUT CONTRAST CT CERVICAL SPINE WITHOUT CONTRAST TECHNIQUE: Multidetector CT imaging of the head and cervical spine was performed following the standard protocol without intravenous contrast. Multiplanar CT image reconstructions of the cervical spine were also generated. COMPARISON:  Head and cervical spine CT scans 02/14/2015. FINDINGS: CT HEAD FINDINGS Brain: There is some cortical atrophy and chronic microvascular ischemic change. No evidence of acute abnormality including hemorrhage, infarct, mass lesion, mass effect, midline shift or abnormal extra-axial fluid collection is identified. Vascular: No hyperdense vessel or unexpected calcification. Skull: Intact. Sinuses/Orbits: Negative. Other: None. CT CERVICAL SPINE FINDINGS Alignment: Maintained. Skull base and vertebrae: No acute fracture. No primary bone lesion or focal pathologic process. Soft tissues and spinal canal: No prevertebral fluid or swelling. No visible canal hematoma. Disc levels: Mild loss of disc space height is seen at C3-4. Scattered facet degenerative change appears worst at C2-3 and C3-4. Upper chest: Emphysematous changes identified. Other: None. IMPRESSION: No acute abnormality head or cervical spine. Emphysema. Electronically Signed   By: Drusilla Kanner M.D.   On: 10/12/2016 13:54    Procedures Procedures (including critical care time)  Medications Ordered in ED Medications - No data to display   Initial Impression / Assessment and Plan / ED Course  I have reviewed the triage vital signs and the nursing notes.  Pertinent labs & imaging results that were  available during my care of the patient were reviewed by me and considered in my medical decision making (see chart for details).  CT for head and neck shows no acute trauma. chest x-ray unremarkable. Diagnosis fall with contusion to chest. Patient will take Tylenol for pain follow-up as needed  Final Clinical Impressions(s) / ED Diagnoses   Final diagnoses:  Fall, initial encounter    New Prescriptions New Prescriptions   No medications on file     Bethann Berkshire, MD 10/12/16 1434

## 2016-10-15 ENCOUNTER — Encounter (HOSPITAL_COMMUNITY): Payer: Self-pay

## 2016-10-15 ENCOUNTER — Emergency Department (HOSPITAL_COMMUNITY)
Admission: EM | Admit: 2016-10-15 | Discharge: 2016-10-15 | Disposition: A | Discharge status: Home / Self Care | Payer: Medicare Other | Attending: Emergency Medicine | Admitting: Emergency Medicine

## 2016-10-15 ENCOUNTER — Emergency Department (HOSPITAL_COMMUNITY): Payer: Medicare Other

## 2016-10-15 DIAGNOSIS — E039 Hypothyroidism, unspecified: Secondary | ICD-10-CM | POA: Insufficient documentation

## 2016-10-15 DIAGNOSIS — R079 Chest pain, unspecified: Secondary | ICD-10-CM | POA: Diagnosis present

## 2016-10-15 DIAGNOSIS — F17210 Nicotine dependence, cigarettes, uncomplicated: Secondary | ICD-10-CM | POA: Diagnosis not present

## 2016-10-15 DIAGNOSIS — J449 Chronic obstructive pulmonary disease, unspecified: Secondary | ICD-10-CM | POA: Insufficient documentation

## 2016-10-15 DIAGNOSIS — Z79899 Other long term (current) drug therapy: Secondary | ICD-10-CM | POA: Diagnosis not present

## 2016-10-15 DIAGNOSIS — R0789 Other chest pain: Secondary | ICD-10-CM | POA: Diagnosis not present

## 2016-10-15 LAB — BASIC METABOLIC PANEL
ANION GAP: 7 (ref 5–15)
BUN: 16 mg/dL (ref 6–20)
CO2: 29 mmol/L (ref 22–32)
Calcium: 8.4 mg/dL — ABNORMAL LOW (ref 8.9–10.3)
Chloride: 101 mmol/L (ref 101–111)
Creatinine, Ser: 0.74 mg/dL (ref 0.44–1.00)
GFR calc Af Amer: >60 mL/min (ref >60)
Glucose, Bld: 130 mg/dL — ABNORMAL HIGH (ref 65–99)
POTASSIUM: 3.7 mmol/L (ref 3.5–5.1)
SODIUM: 137 mmol/L (ref 135–145)

## 2016-10-15 LAB — CBC
HEMATOCRIT: 38.5 % (ref 36.0–46.0)
Hemoglobin: 12.6 g/dL (ref 12.0–15.0)
MCH: 28.8 pg (ref 26.0–34.0)
MCHC: 32.7 g/dL (ref 30.0–36.0)
MCV: 88.1 fL (ref 78.0–100.0)
Platelets: 224 10*3/uL (ref 150–400)
RBC: 4.37 MIL/uL (ref 3.87–5.11)
RDW: 13.7 % (ref 11.5–15.5)
WBC: 10.1 10*3/uL (ref 4.0–10.5)

## 2016-10-15 LAB — TROPONIN I: Troponin I: <0.03 ng/mL (ref <0.03)

## 2016-10-15 NOTE — ED Provider Notes (Signed)
[facility]-EM[facility]ENCY DEPT Provider Note   CSN: [id number] Arrival date & time: 10/15/16  1624     History   Chief Complaint Chief Complaint  Patient presents with  . Chest Pain    HPI Tina Ellison is a 71 y.o. female.  Patient states he fell and hit her chest. No loss consciousness. Patient complains of tenderness to left  chest wall   The history is provided by the patient. No language interpreter was used.  Chest Pain   This is a recurrent problem. The current episode started more than 2 days ago. The problem occurs rarely. The problem has not changed since onset.The pain is associated with movement. The pain is present in the lateral region. The pain is at a severity of 5/10. The pain is moderate. The quality of the pain is described as brief. Pertinent negatives include no abdominal pain, no back pain, no cough and no headaches.  Pertinent negatives for past medical history include no seizures.    Past Medical History:  Diagnosis Date  . Anxiety   . Anxiety disorder   . COPD (chronic obstructive pulmonary disease) (HCC)   . Depression   . GERD (gastroesophageal reflux disease)   . Hypothyroidism   . Memory loss   . Osteoporosis   . Overactive bladder   . Schizoaffective disorder (HCC)   . Schizophrenia (HCC)   . Thrombocytopenia (HCC)   . Urinary retention   . UTI (lower urinary tract infection)   . Vitamin D deficiency   . Vitamin deficiency   . Weakness     Patient Active Problem List   Diagnosis Date Noted  . UTI (urinary tract infection) 06/21/2016  . Disorganized schizophrenia (HCC)   . Schizophrenia (HCC) 07/07/2014  . Diabetes (HCC) 07/07/2014    Past Surgical History:  Procedure Laterality Date  . BREAST SURGERY Right     OB History    No data available       Home Medications    Prior to Admission medications   Medication Sig Start Date End Date Taking? Authorizing Provider  alendronate (FOSAMAX) 70 MG tablet Take 70 mg by mouth once a  week. Take with a full glass of water on an empty stomach. Every Monday.   Yes [provider]  atorvastatin (LIPITOR) 10 MG tablet Take 10 mg by mouth every evening.   Yes [provider]  benztropine (COGENTIN) 1 MG tablet Take 1 mg by mouth 2 (two) times daily.   Yes [provider]  cholecalciferol (VITAMIN D) 1000 units tablet Take 1,000 Units by mouth daily.   Yes [provider]  divalproex (DEPAKOTE ER) 500 MG 24 hr tablet Take 500 mg by mouth 2 (two) times daily.    Yes [provider]  docusate sodium (COLACE) 100 MG capsule Take 100 mg by mouth 2 (two) times daily.   Yes [provider]  folic acid (FOLVITE) 400 MCG tablet Take 400 mcg by mouth daily.   Yes [provider]  haloperidol (HALDOL) 10 MG tablet Take 10 mg by mouth 2 (two) times daily.   Yes [provider]  levothyroxine (SYNTHROID, LEVOTHROID) 50 MCG tablet Take 50 mcg by mouth daily before breakfast.   Yes [provider]  Melatonin 3 MG TABS Take 3 mg by mouth at bedtime.   Yes [provider]  Multiple Vitamin (MULTIVITAMIN WITH MINERALS) TABS tablet Take 1 tablet by mouth daily.   Yes [provider]  OLANZapine (ZYPREXA) 20  MG tablet Take 20 mg by mouth daily.   Yes [provider]  omeprazole (PRILOSEC) 20 MG capsule Take 20 mg by mouth daily.   Yes [provider]  oxybutynin (DITROPAN) 5 MG tablet Take 5 mg by mouth daily.   Yes [provider]  traZODone (DESYREL) 50 MG tablet Take 25-50 mg by mouth 2 (two) times daily. *Take one tablet daily in the morning and one-half tablet daily at bedtime   Yes [provider]    Family History No family history on file.  Social History Social History  Substance Use Topics  . Smoking status: Current Every Day Smoker    Packs/day: 0.50    Types: Cigarettes  . Smokeless tobacco: Never Used  . Alcohol use No     Allergies   Haldol  [haloperidol]   Review of Systems Review of Systems  Constitutional: Negative for appetite change and fatigue.  HENT: Negative for congestion, ear discharge and sinus pressure.   Eyes: Negative for discharge.  Respiratory: Negative for cough.   Cardiovascular: Positive for chest pain.  Gastrointestinal: Negative for abdominal pain and diarrhea.  Genitourinary: Negative for frequency and hematuria.  Musculoskeletal: Negative for back pain.  Skin: Negative for rash.  Neurological: Negative for seizures and headaches.  Psychiatric/Behavioral: Negative for hallucinations.     Physical Exam Updated Vital Signs BP 105/69   Pulse 95   Temp 99 F (37.2 C) (Oral)   Resp 20   SpO2 97%   Physical Exam  Constitutional: She is oriented to person, place, and time. She appears well-developed.  HENT:  Head: Normocephalic.  Eyes: Conjunctivae and EOM are normal. No scleral icterus.  Neck: Neck supple. No thyromegaly present.  Cardiovascular: Normal rate and regular rhythm.  Exam reveals no gallop and no friction rub.   No murmur heard. Tender left chest  Pulmonary/Chest: No stridor. She has no wheezes. She has no rales. She exhibits no tenderness.  Abdominal: She exhibits no distension. There is no tenderness. There is no rebound.  Musculoskeletal: Normal range of motion. She exhibits no edema.  Lymphadenopathy:    She has no cervical adenopathy.  Neurological: She is oriented to person, place, and time. She exhibits normal muscle tone. Coordination normal.  Skin: No rash noted. No erythema.  Psychiatric: She has a normal mood and affect. Her behavior is normal.     ED Treatments / Results  Labs (all labs ordered are listed, but only abnormal results are displayed) Labs Reviewed  BASIC METABOLIC PANEL - Abnormal; Notable for the following:       Result Value   Glucose, Bld 130 (*)    Calcium 8.4 (*)    All other components within normal limits  CBC  TROPONIN I    EKG  EKG  Interpretation None       Radiology Dg Chest 2 View  Result Date: 10/15/2016 CLINICAL DATA:  Chest pain. EXAM: CHEST  2 VIEW COMPARISON:  Chest x-ray dated October 12, 2016. FINDINGS: The cardiomediastinal silhouette is normal in size. Normal pulmonary vascularity. No focal consolidation, pleural effusion, or pneumothorax. Prior right mastectomy and axillary lymph node dissection. No acute osseous abnormality. IMPRESSION: No active cardiopulmonary disease. Electronically Signed   By: Obie Dredge M.D.   On: 10/15/2016 17:00    Procedures Procedures (including critical care time)  Medications Ordered in ED Medications - No data to display   Initial Impression / Assessment and Plan / ED Course  I have reviewed the triage  vital signs and the nursing notes.  Pertinent labs & imaging results that were available during my care of the patient were reviewed by me and considered in my medical decision making (see chart for details).     Chest wall pain  Final Clinical Impressions(s) / ED Diagnoses   Final diagnoses:  Atypical chest pain    New Prescriptions New Prescriptions   No medications on file     Bethann Berkshire, MD 10/15/16 1858
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
## 2016-10-15 NOTE — Discharge Instructions (Signed)
Follow-up with your doctor if problems

## 2016-10-15 NOTE — ED Notes (Signed)
Called [facility] to come pick up pt.

## 2016-10-15 NOTE — ED Notes (Signed)
Pt.'s O2 sat dropped to low 80's on RA while pt was sleeping. Started pt on 2L [place] and O2 sats have come up to 95%

## 2016-10-15 NOTE — ED Triage Notes (Signed)
Pt from [facility].   Pt came in by [facility]. Pt is complaining of chest pain that started last week. Chest pain in central chest. Hurts worse with palpation. Pain doesn't radiate anywhere.Pt is also very weak.    12L EKG was normal per EMS  [facility] concerned of UTI.  Hallucinations upon arrival by EMS  History of Schizoaffective, anxiety, and hypothyroidism

## 2016-10-21 ENCOUNTER — Inpatient Hospital Stay: Payer: Medicare Other

## 2016-10-21 ENCOUNTER — Emergency Department: Payer: Medicare Other

## 2016-10-21 ENCOUNTER — Encounter: Payer: Self-pay | Admitting: Emergency Medicine

## 2016-10-21 ENCOUNTER — Inpatient Hospital Stay
Admission: EM | Admit: 2016-10-21 | Discharge: 2016-10-26 | DRG: 871 | Disposition: A | Discharge status: Home / Self Care | Payer: Medicare Other | Attending: Internal Medicine | Admitting: Internal Medicine

## 2016-10-21 DIAGNOSIS — F17210 Nicotine dependence, cigarettes, uncomplicated: Secondary | ICD-10-CM | POA: Diagnosis present

## 2016-10-21 DIAGNOSIS — E039 Hypothyroidism, unspecified: Secondary | ICD-10-CM | POA: Diagnosis present

## 2016-10-21 DIAGNOSIS — F259 Schizoaffective disorder, unspecified: Secondary | ICD-10-CM | POA: Diagnosis present

## 2016-10-21 DIAGNOSIS — W19XXXA Unspecified fall, initial encounter: Secondary | ICD-10-CM | POA: Diagnosis present

## 2016-10-21 DIAGNOSIS — Y95 Nosocomial condition: Secondary | ICD-10-CM | POA: Diagnosis present

## 2016-10-21 DIAGNOSIS — M5124 Other intervertebral disc displacement, thoracic region: Secondary | ICD-10-CM | POA: Diagnosis present

## 2016-10-21 DIAGNOSIS — R531 Weakness: Secondary | ICD-10-CM | POA: Diagnosis present

## 2016-10-21 DIAGNOSIS — A419 Sepsis, unspecified organism: Secondary | ICD-10-CM | POA: Diagnosis present

## 2016-10-21 DIAGNOSIS — J449 Chronic obstructive pulmonary disease, unspecified: Secondary | ICD-10-CM | POA: Diagnosis present

## 2016-10-21 DIAGNOSIS — M4854XA Collapsed vertebra, not elsewhere classified, thoracic region, initial encounter for fracture: Secondary | ICD-10-CM | POA: Diagnosis present

## 2016-10-21 DIAGNOSIS — K219 Gastro-esophageal reflux disease without esophagitis: Secondary | ICD-10-CM | POA: Diagnosis present

## 2016-10-21 DIAGNOSIS — N136 Pyonephrosis: Secondary | ICD-10-CM | POA: Diagnosis present

## 2016-10-21 DIAGNOSIS — M79669 Pain in unspecified lower leg: Secondary | ICD-10-CM

## 2016-10-21 DIAGNOSIS — R319 Hematuria, unspecified: Secondary | ICD-10-CM | POA: Diagnosis not present

## 2016-10-21 DIAGNOSIS — N1330 Unspecified hydronephrosis: Secondary | ICD-10-CM

## 2016-10-21 DIAGNOSIS — Z79899 Other long term (current) drug therapy: Secondary | ICD-10-CM

## 2016-10-21 DIAGNOSIS — M810 Age-related osteoporosis without current pathological fracture: Secondary | ICD-10-CM | POA: Diagnosis present

## 2016-10-21 DIAGNOSIS — R4182 Altered mental status, unspecified: Secondary | ICD-10-CM

## 2016-10-21 DIAGNOSIS — N390 Urinary tract infection, site not specified: Secondary | ICD-10-CM

## 2016-10-21 DIAGNOSIS — Z888 Allergy status to other drugs, medicaments and biological substances status: Secondary | ICD-10-CM

## 2016-10-21 DIAGNOSIS — M549 Dorsalgia, unspecified: Secondary | ICD-10-CM | POA: Diagnosis present

## 2016-10-21 DIAGNOSIS — G9341 Metabolic encephalopathy: Secondary | ICD-10-CM | POA: Diagnosis present

## 2016-10-21 DIAGNOSIS — Z7983 Long term (current) use of bisphosphonates: Secondary | ICD-10-CM | POA: Diagnosis not present

## 2016-10-21 LAB — ACETAMINOPHEN LEVEL

## 2016-10-21 LAB — FIBRIN DERIVATIVES D-DIMER (ARMC ONLY): Fibrin derivatives D-dimer (ARMC): 2023.03 — ABNORMAL HIGH (ref 0.00–499.00)

## 2016-10-21 LAB — COMPREHENSIVE METABOLIC PANEL
ALBUMIN: 2.3 g/dL — AB (ref 3.5–5.0)
ALK PHOS: 96 U/L (ref 38–126)
ALT: 13 U/L — AB (ref 14–54)
AST: 15 U/L (ref 15–41)
Anion gap: 9 (ref 5–15)
BILIRUBIN TOTAL: 0.4 mg/dL (ref 0.3–1.2)
BUN: 16 mg/dL (ref 6–20)
CO2: 30 mmol/L (ref 22–32)
CREATININE: 0.97 mg/dL (ref 0.44–1.00)
Calcium: 8.6 mg/dL — ABNORMAL LOW (ref 8.9–10.3)
Chloride: 99 mmol/L — ABNORMAL LOW (ref 101–111)
GFR calc Af Amer: >60 mL/min (ref >60)
GFR, EST NON AFRICAN AMERICAN: 58 mL/min — AB (ref >60)
GLUCOSE: 151 mg/dL — AB (ref 65–99)
POTASSIUM: 4.3 mmol/L (ref 3.5–5.1)
Sodium: 138 mmol/L (ref 135–145)
TOTAL PROTEIN: 6.5 g/dL (ref 6.5–8.1)

## 2016-10-21 LAB — URINALYSIS, COMPLETE (UACMP) WITH MICROSCOPIC
Bilirubin Urine: NEGATIVE
GLUCOSE, UA: 150 mg/dL — AB
KETONES UR: 5 mg/dL — AB
NITRITE: NEGATIVE
PH: 6 (ref 5.0–8.0)
Protein, ur: 100 mg/dL — AB
Specific Gravity, Urine: 1.021 (ref 1.005–1.030)
Squamous Epithelial / LPF: NONE SEEN

## 2016-10-21 LAB — URINE DRUG SCREEN, QUALITATIVE (ARMC ONLY)
Amphetamines, Ur Screen: NOT DETECTED
Barbiturates, Ur Screen: NOT DETECTED
Benzodiazepine, Ur Scrn: NOT DETECTED
Cannabinoid 50 Ng, Ur ~~LOC~~: NOT DETECTED
Cocaine Metabolite,Ur ~~LOC~~: NOT DETECTED
MDMA (Ecstasy)Ur Screen: NOT DETECTED
Methadone Scn, Ur: NOT DETECTED
Opiate, Ur Screen: NOT DETECTED
Phencyclidine (PCP) Ur S: NOT DETECTED
Tricyclic, Ur Screen: NOT DETECTED

## 2016-10-21 LAB — CBC WITH DIFFERENTIAL/PLATELET
BASOS ABS: 0 10*3/uL (ref 0–0.1)
Basophils Relative: 0 %
EOS PCT: 0 %
Eosinophils Absolute: 0 10*3/uL (ref 0–0.7)
HEMATOCRIT: 43.3 % (ref 35.0–47.0)
Hemoglobin: 14.6 g/dL (ref 12.0–16.0)
LYMPHS ABS: 1.6 10*3/uL (ref 1.0–3.6)
LYMPHS PCT: 10 %
MCH: 28.8 pg (ref 26.0–34.0)
MCHC: 33.7 g/dL (ref 32.0–36.0)
MCV: 85.4 fL (ref 80.0–100.0)
MONOS PCT: 17 %
Monocytes Absolute: 2.8 10*3/uL — ABNORMAL HIGH (ref 0.2–0.9)
NEUTROS PCT: 73 %
Neutro Abs: 12 10*3/uL — ABNORMAL HIGH (ref 1.4–6.5)
Platelets: 324 10*3/uL (ref 150–440)
RBC: 5.07 MIL/uL (ref 3.80–5.20)
RDW: 14.8 % — AB (ref 11.5–14.5)
WBC: 16.4 10*3/uL — AB (ref 3.6–11.0)

## 2016-10-21 LAB — LACTIC ACID, PLASMA
LACTIC ACID, VENOUS: 1.4 mmol/L (ref 0.5–1.9)
LACTIC ACID, VENOUS: 1 mmol/L (ref 0.5–1.9)

## 2016-10-21 LAB — PROCALCITONIN

## 2016-10-21 LAB — TSH: TSH: 1.018 u[IU]/mL (ref 0.350–4.500)

## 2016-10-21 LAB — TROPONIN I: Troponin I: <0.03 ng/mL (ref <0.03)

## 2016-10-21 LAB — VALPROIC ACID LEVEL: VALPROIC ACID LVL: 78 ug/mL (ref 50.0–100.0)

## 2016-10-21 LAB — BRAIN NATRIURETIC PEPTIDE: B Natriuretic Peptide: 33 pg/mL (ref 0.0–100.0)

## 2016-10-21 LAB — SALICYLATE LEVEL: Salicylate Lvl: <7 mg/dL (ref 2.8–30.0)

## 2016-10-21 MED ORDER — VANCOMYCIN HCL IN DEXTROSE 750-5 MG/150ML-% IV SOLN
750.0000 mg | Freq: Two times a day (BID) | INTRAVENOUS | Status: DC
  Administered 2016-10-22 – 2016-10-23 (×4): 750 mg via INTRAVENOUS
  Filled 2016-10-21 (×5): qty 150

## 2016-10-21 MED ORDER — OXYBUTYNIN CHLORIDE 5 MG PO TABS
5.0000 mg | ORAL_TABLET | Freq: Every day | ORAL | Status: DC
Start: 2016-10-22 — End: 2016-10-26
  Administered 2016-10-22 – 2016-10-26 (×5): 5 mg via ORAL
  Filled 2016-10-21 (×5): qty 1

## 2016-10-21 MED ORDER — SODIUM CHLORIDE 0.9 % IV SOLN
Freq: Once | INTRAVENOUS | Status: AC
  Administered 2016-10-21: 14:00:00 via INTRAVENOUS

## 2016-10-21 MED ORDER — VANCOMYCIN HCL IN DEXTROSE 750-5 MG/150ML-% IV SOLN
750.0000 mg | Freq: Once | INTRAVENOUS | Status: AC
  Administered 2016-10-21: 750 mg via INTRAVENOUS
  Filled 2016-10-21: qty 150

## 2016-10-21 MED ORDER — OLANZAPINE 10 MG PO TABS
20.0000 mg | ORAL_TABLET | Freq: Every day | ORAL | Status: DC
  Administered 2016-10-22 – 2016-10-26 (×5): 20 mg via ORAL
  Filled 2016-10-21 (×5): qty 2

## 2016-10-21 MED ORDER — ONDANSETRON HCL 4 MG PO TABS: 4.0000 mg | ORAL_TABLET | Freq: Four times a day (QID) | ORAL | Status: DC | PRN

## 2016-10-21 MED ORDER — BENZTROPINE MESYLATE 1 MG PO TABS
1.0000 mg | ORAL_TABLET | Freq: Two times a day (BID) | ORAL | Status: DC
  Administered 2016-10-21 – 2016-10-26 (×9): 1 mg via ORAL
  Filled 2016-10-21 (×11): qty 1

## 2016-10-21 MED ORDER — ACETAMINOPHEN 650 MG RE SUPP: 650.0000 mg | Freq: Four times a day (QID) | RECTAL | Status: DC | PRN

## 2016-10-21 MED ORDER — CEFTRIAXONE SODIUM 1 G IJ SOLR
1.0000 g | Freq: Once | INTRAMUSCULAR | Status: AC
  Administered 2016-10-21: 1 g via INTRAVENOUS

## 2016-10-21 MED ORDER — ATORVASTATIN CALCIUM 20 MG PO TABS
10.0000 mg | ORAL_TABLET | Freq: Every evening | ORAL | Status: DC
  Administered 2016-10-21 – 2016-10-25 (×5): 10 mg via ORAL
  Filled 2016-10-21 (×5): qty 1

## 2016-10-21 MED ORDER — ACETAMINOPHEN 325 MG PO TABS
650.0000 mg | ORAL_TABLET | Freq: Four times a day (QID) | ORAL | Status: DC | PRN
  Administered 2016-10-22 – 2016-10-24 (×3): 650 mg via ORAL
  Filled 2016-10-21 (×3): qty 2

## 2016-10-21 MED ORDER — DEXTROSE 5 % IV SOLN
INTRAVENOUS | Status: AC
  Filled 2016-10-21: qty 10

## 2016-10-21 MED ORDER — IOPAMIDOL (ISOVUE-370) INJECTION 76%
75.0000 mL | Freq: Once | INTRAVENOUS | Status: AC | PRN
  Administered 2016-10-21: 75 mL via INTRAVENOUS

## 2016-10-21 MED ORDER — SODIUM CHLORIDE 0.9 % IV SOLN
INTRAVENOUS | Status: AC
  Administered 2016-10-21 (×2): via INTRAVENOUS

## 2016-10-21 MED ORDER — PIPERACILLIN-TAZOBACTAM 3.375 G IVPB
3.3750 g | Freq: Three times a day (TID) | INTRAVENOUS | Status: DC
  Administered 2016-10-21 – 2016-10-23 (×6): 3.375 g via INTRAVENOUS
  Filled 2016-10-21 (×5): qty 50

## 2016-10-21 MED ORDER — PIPERACILLIN-TAZOBACTAM 3.375 G IVPB
3.3750 g | Freq: Once | INTRAVENOUS | Status: DC
  Filled 2016-10-21: qty 50

## 2016-10-21 MED ORDER — ENOXAPARIN SODIUM 40 MG/0.4ML ~~LOC~~ SOLN
40.0000 mg | SUBCUTANEOUS | Status: DC
  Administered 2016-10-21 – 2016-10-23 (×2): 40 mg via SUBCUTANEOUS
  Filled 2016-10-21 (×4): qty 0.4

## 2016-10-21 MED ORDER — ONDANSETRON HCL 4 MG/2ML IJ SOLN: 4.0000 mg | Freq: Four times a day (QID) | INTRAMUSCULAR | Status: DC | PRN

## 2016-10-21 MED ORDER — LEVOTHYROXINE SODIUM 50 MCG PO TABS
50.0000 ug | ORAL_TABLET | Freq: Every day | ORAL | Status: DC
  Administered 2016-10-22 – 2016-10-26 (×5): 50 ug via ORAL
  Filled 2016-10-21 (×5): qty 1

## 2016-10-21 NOTE — NC FL2 (Signed)
[place] MEDICAID FL2 LEVEL OF CARE SCREENING TOOL     IDENTIFICATION  Patient Name: Tina Ellison Birthdate: 05/14/1945 Sex: female Admission Date (Current Location): 10/21/2016  [place]   Facility and Address:  [facility], [address], [place], [place] [address]      Provider Number: [id number]  Attending Physician Name and Address:  Delfino LovettVipul Shah, MD  Relative Name and Phone Number:   Gordy SaversKiley Moore APS guardian, [phone]    Current Level of Care: Skilled nursing facility Recommended Level of Care: Skilled Nursing Facility Prior Approval Number:    Date Approved/Denied:   PASRR Number:  Pending  Discharge Plan: Pending    Current Diagnoses: Patient Active Problem List   Diagnosis Date Noted  . UTI (urinary tract infection) 06/21/2016  . Disorganized schizophrenia (HCC)   . Schizophrenia (HCC) 07/07/2014  . Diabetes (HCC) 07/07/2014    Orientation RESPIRATION BLADDER Height & Weight     Self  Normal Incontinent Weight:   Height:     BEHAVIORAL SYMPTOMS/MOOD NEUROLOGICAL BOWEL NUTRITION STATUS      Incontinent Diet (None)  AMBULATORY STATUS COMMUNICATION OF NEEDS Skin   Limited Assist Verbally Normal                       Personal Care Assistance Level of Assistance  Bathing, Feeding, Dressing, Total care Bathing Assistance: Limited assistance Feeding assistance: Limited assistance Dressing Assistance: Limited assistance Total Care Assistance: Limited assistance   Functional Limitations Info  Sight, Hearing, Speech Sight Info: Impaired (Left eye catarac) Hearing Info: Adequate Speech Info: Adequate    SPECIAL CARE FACTORS FREQUENCY   Skilled Physical Therapy Occupational Therapy  PT 5x a week  OT 5x a week                  Contractures Contractures Info: Not present    Additional Factors Info  Allergies   Allergies Info: Haloperidol           Current Medications  (10/21/2016):  This is the current hospital active medication list Current Facility-Administered Medications  Medication Dose Route Frequency Provider Last Rate Last Dose  . cefTRIAXone (ROCEPHIN) 1 g in dextrose 5 % 50 mL IVPB  1 g Intravenous Once Arnaldo NatalMalinda, Paul F, MD 100 mL/hr at 10/21/16 1418 1 g at 10/21/16 1418  . iopamidol (ISOVUE-370) 76 % injection 75 mL  75 mL Intravenous Once PRN Arnaldo NatalMalinda, Paul F, MD       Current Outpatient Prescriptions  Medication Sig Dispense Refill  . alendronate (FOSAMAX) 70 MG tablet Take 70 mg by mouth once a week. Take with a full glass of water on an empty stomach. Every Monday.    Marland Kitchen. atorvastatin (LIPITOR) 10 MG tablet Take 10 mg by mouth every evening.    . benztropine (COGENTIN) 1 MG tablet Take 1 mg by mouth 2 (two) times daily.    . cholecalciferol (VITAMIN D) 1000 units tablet Take 1,000 Units by mouth daily.    . divalproex (DEPAKOTE ER) 500 MG 24 hr tablet Take 500 mg by mouth 2 (two) times daily.     Marland Kitchen. docusate sodium (COLACE) 100 MG capsule Take 100 mg by mouth 2 (two) times daily.    . folic acid (FOLVITE) 400 MCG tablet Take 400 mcg by mouth daily.    . haloperidol (HALDOL) 10 MG tablet Take 10 mg by mouth 2 (two) times daily.    Marland Kitchen. levothyroxine (SYNTHROID, LEVOTHROID)  50 MCG tablet Take 50 mcg by mouth daily before breakfast.    . Melatonin 3 MG TABS Take 3 mg by mouth at bedtime.    . Multiple Vitamin (MULTIVITAMIN WITH MINERALS) TABS tablet Take 1 tablet by mouth daily.    Marland Kitchen OLANZapine (ZYPREXA) 20 MG tablet Take 20 mg by mouth daily.    Marland Kitchen omeprazole (PRILOSEC) 20 MG capsule Take 20 mg by mouth daily.    Marland Kitchen oxybutynin (DITROPAN) 5 MG tablet Take 5 mg by mouth daily.    . traZODone (DESYREL) 50 MG tablet Take 25-50 mg by mouth 2 (two) times daily. *Take one tablet daily in the morning and one-half tablet daily at bedtime       Discharge Medications: Please see discharge summary for a list of discharge medications.  Relevant Imaging  Results:  Relevant Lab Results:   Additional Information SSN: [id number]  Cheron Schaumann, [place]  Windell Moulding, MSW, [place], updated 10-24-16

## 2016-10-21 NOTE — ED Notes (Signed)
Pt has fall alarm placed at this time. Pt has been moved closer to nurses station per charge RN

## 2016-10-21 NOTE — Clinical Social Work Note (Signed)
Clinical Social Work Assessment  Patient Details  Name: Tina Ellison MRN: [id number] Date of Birth: [birth year]  Date of referral:  10/21/16               Reason for consult:                   Permission sought to share information with:  Family Supports, Guardian Permission granted to share information::     Name::        Agency::  [facility] [phone]  Relationship::  DSS Guardian [phone] Tina Ellison  Contact Information:     Housing/Transportation Living arrangements for the past 2 months:   ( [facility];) Source of Information:  Facility Patient Interpreter Needed:  None Criminal Activity/Legal Involvement Pertinent to Current Situation/Hospitalization:    Significant Relationships:  Merchandiser, retailCommunity Support Lives with:  Facility Resident Do you feel safe going back to the place where you live?  Yes Need for family participation in patient care:  Yes (Comment)  Care giving concerns:  Spoke with [name] Person her Quarry managerhouse manager who lives with her states patient is usually energetic and independent with all her ADL and in the last week she has not been herself ,she is droopy, unable to walk as far, unsteady, weak and lays on the couch. Completely dependant on her caregivers.   Social Worker assessment / plan: LCSW called and left message for patients Guardian Tina Ellison [phone] DSS of [place]. [facility]Call[facility]  [phone] and spoke to her provider  [name] Person her house manager who lives with her. Caregiver states patient is usually energetic and independent with all her ADL and in the last week she has not been herself ,she is droopy, unable to walk as far, unsteady, weak and lays on the couch, no appetitie. Completely dependant on her caregivers. Patient is incontinent and uses pull ups, her appetite has decreased in the last weak and she is usually able to use walker however too weak to move this week. Caregiver  states someone says she is diabetic however she has never been on medications for this issue. Patient is insured medicare/medicaid. Awaiting further medical work up. LCSW completed assessment and started Fl2    Employment status:   retired Health and safety inspectornsurance information:  Medicare, [place] Recommendations:  Not assessed at this time Information / Referral to community resources:  Other (Comment Required) (None)  Patient/Family's Response to care:  Nira ConnNancy Person [facility] would like to know what is causing this.  Patient/Family's Understanding of and Emotional Response to Diagnosis, Current Treatment, and Prognosis: Cognition issues  Emotional Assessment Appearance:  Appears stated age Attitude/Demeanor/Rapport:   (Friendly/energetic) Affect (typically observed):  Withdrawn, Flat Orientation:  Oriented to Self Alcohol / Substance use:  Not Applicable Psych involvement (Current and /or in the community):  Yes (Comment) (See a psychiatrist nurse practioner)  Discharge Needs  Concerns to be addressed:  Cognitive Concerns Readmission within the last 30 days:  Yes Current discharge risk:  Psychiatric Illness Barriers to Discharge:  Continued Medical Work up   [place], Blanchelaudine M, LCSW 10/21/2016, 2:09 PM

## 2016-10-21 NOTE — ED Notes (Signed)
[phone] [name] from care facility for pt

## 2016-10-21 NOTE — ED Provider Notes (Signed)
[facility] Emergency Department Provider Note   ____________________________________________   First MD Initiated Contact with Patient 10/21/16 1124     (approximate)  I have reviewed the triage vital signs and the nursing notes.   HISTORY  Chief Complaint Altered Mental Status    HPI Tina Ellison is a 71 y.o. female Patient comes from assisted living. EMS reports that she fell on tHE 24TH AND [facility]. sHE HAD A NEG CT H AND CT CSPINE. AND NEG CXR.    She went back to [facility] on the 27th as she was having deteriorating mental status. She has went outside in her diaper which she never does. She is usually vibrant and active and described as t he motyher to  All the other people there. Here patient is very sleepy, falling asleep during my exam. Pt denies cough, chest pain, sob and any pain anywhere. She doesn't think she has a fever.    Past Medical History:  Diagnosis Date  . Anxiety   . Anxiety disorder   . COPD (chronic obstructive pulmonary disease) (HCC)   . Depression   . GERD (gastroesophageal reflux disease)   . Hypothyroidism   . Memory loss   . Osteoporosis   . Overactive bladder   . Schizoaffective disorder (HCC)   . Schizophrenia (HCC)   . Thrombocytopenia (HCC)   . Urinary retention   . UTI (lower urinary tract infection)   . Vitamin D deficiency   . Vitamin deficiency   . Weakness     Patient Active Problem List   Diagnosis Date Noted  . UTI (urinary tract infection) 06/21/2016  . Disorganized schizophrenia (HCC)   . Schizophrenia (HCC) 07/07/2014  . Diabetes (HCC) 07/07/2014    Past Surgical History:  Procedure Laterality Date  . BREAST SURGERY Right     Prior to Admission medications   Medication Sig Start Date End Date Taking? Authorizing Provider  alendronate (FOSAMAX) 70 MG tablet Take 70 mg by mouth once a week. Take with a full glass of water on an empty stomach. Every Monday.   Yes [provider]  atorvastatin (LIPITOR) 10 MG tablet Take 10 mg by mouth every evening.   Yes [provider]  benztropine (COGENTIN) 1 MG tablet Take 1 mg by mouth 2 (two) times daily.   Yes [provider]  cholecalciferol (VITAMIN D) 1000 units tablet Take 1,000 Units by mouth daily.   Yes [provider]  divalproex (DEPAKOTE ER) 500 MG 24 hr tablet Take 500 mg by mouth 2 (two) times daily.    Yes [provider]  docusate sodium (COLACE) 100 MG capsule Take 100 mg by mouth 2 (two) times daily.   Yes [provider]  folic acid (FOLVITE) 400 MCG tablet Take 400 mcg by mouth daily.   Yes [provider]  haloperidol (HALDOL) 10 MG tablet Take 10 mg by mouth 2 (two) times daily.   Yes [provider]  levothyroxine (SYNTHROID, LEVOTHROID) 50 MCG tablet Take 50 mcg by mouth daily before breakfast.   Yes [provider]  Melatonin 3 MG TABS Take 3 mg by mouth at bedtime.   Yes [provider]  Multiple Vitamin (MULTIVITAMIN WITH MINERALS) TABS tablet Take 1 tablet by mouth daily.   Yes [provider]  OLANZapine (ZYPREXA) 20 MG tablet Take 20 mg by mouth daily.   Yes [provider]  omeprazole (PRILOSEC) 20 MG capsule Take  20 mg by mouth daily.   Yes [provider]  oxybutynin (DITROPAN) 5 MG tablet Take 5 mg by mouth daily.   Yes [provider]  traZODone (DESYREL) 50 MG tablet Take 25-50 mg by mouth 2 (two) times daily. *Take one tablet daily in the morning and one-half tablet daily at bedtime   Yes [provider]    Allergies Haldol [haloperidol]  No family history on file.  Social History Social History  Substance Use Topics  . Smoking status: Current Every Day Smoker    Packs/day: 0.50    Types: Cigarettes  . Smokeless tobacco: Never Used  . Alcohol use No    Review of Systems difficult to obtain due to sleepiness- as best as I can  tell: Constitutional: No fever/chills Eyes: No visual changes. ENT: No sore throat. Cardiovascular: Denies chest pain. Respiratory: Denies shortness of breath. Gastrointestinal: No abdominal pain.  No nausea, no vomiting.  No diarrhea.  No constipation. Genitourinary: Negative for dysuria. Musculoskeletal: Negative for back pain. Skin: Negative for rash. Neurological: Negative for headaches, focal weakness   ____________________________________________   PHYSICAL EXAM:  VITAL SIGNS: ED Triage Vitals  Enc Vitals Group     BP      Pulse      Resp      Temp      Temp src      SpO2      Weight      Height      Head Circumference      Peak Flow      Pain Score      Pain Loc      Pain Edu?      Excl. in GC?     Constitutional:sleepy but arousable, in no distress Eyes: Conjunctivae are normal. PERRL. EOMI.  Head: Atraumatic. Nose: No congestion/rhinnorhea. Mouth/Throat: Mucous membranes are moist.  Oropharynx non-erythematous. Neck: No stridor.  Not apparently tnder Cardiovascular: Normal rate, regular rhythm. Grossly normal heart sounds.  Good peripheral circulation. Respiratory: Normal respiratory effort.  No retractions. Lungs CTAB.POOR RESP EFFORT> Gastrointestinal: Soft and nontender. No distention. No abdominal bruits. No CVA tenderness. Musculoskeletal: No lower extremity tenderness nor edema.  Neurologic: difficult to examine but no focal findings. Skin:  Skin is warm, dry and intact. No rash noted.   ____________________________________________   LABS (all labs ordered are listed, but only abnormal results are displayed)  Labs Reviewed  COMPREHENSIVE METABOLIC PANEL - Abnormal; Notable for the following:       Result Value   Chloride 99 (*)    Glucose, Bld 151 (*)    Calcium 8.6 (*)    Albumin 2.3 (*)    ALT 13 (*)    GFR calc non Af Amer 58 (*)    All other components within normal limits  ACETAMINOPHEN LEVEL - Abnormal; Notable for the following:     Acetaminophen (Tylenol), Serum <10 (*)    All other components within normal limits  CBC WITH DIFFERENTIAL/PLATELET - Abnormal; Notable for the following:    WBC 16.4 (*)    RDW 14.8 (*)    Neutro Abs 12.0 (*)    Monocytes Absolute 2.8 (*)    All other components within normal limits  FIBRIN DERIVATIVES D-DIMER ([facility] ONLY) - Abnormal; Notable for the following:    Fibrin derivatives D-dimer [facility]) 2,023.03 (*)    All other components within normal limits  URINALYSIS, COMPLETE (UACMP) WITH MICROSCOPIC - Abnormal; Notable for the following:    Color, Urine  AMBER (*)    APPearance TURBID (*)    Glucose, UA 150 (*)    Hgb urine dipstick SMALL (*)    Ketones, ur 5 (*)    Protein, ur 100 (*)    Leukocytes, UA SMALL (*)    Bacteria, UA FEW (*)    All other components within normal limits  SALICYLATE LEVEL  TROPONIN I  LACTIC ACID, PLASMA  BRAIN NATRIURETIC PEPTIDE  URINE DRUG SCREEN, QUALITATIVE ([facility] ONLY)  TSH  VALPROIC ACID LEVEL  PROCALCITONIN   ____________________________________________  EKG ekg read and interp by me sinus tach @ 117, nl axis, no acute changes. ____________________________________________  RADIOLOGY  IMPRESSION: 1. No acute abnormality. 2. Stable atrophy and chronic small vessel white matter ischemic changes.   Electronically Signed   By: Beckie Salts M.D.   On: 10/21/2016 12:39 ___  Cxr: IMPRESSION: No acute abnormality.   Electronically Signed   By: Beckie Salts M.D.   On: 10/21/2016 12:36 _________________________________________   PROCEDURES  Procedure(s) performed:  Procedures  Critical Care performed:   ____________________________________________   INITIAL IMPRESSION / ASSESSMENT AND PLAN / ED COURSE  Pertinent labs & imaging results that were available during my care of the patient were reviewed by me and considered in my medical decision making (see chart for details).  Patient is tachycardic with a clear  chest x-ray, low sats, high d-dimer  Altered mental status and I will CT her chest to check for PE   CT is negative for PE patient is still altered does have a UTI   ____________________________________________   FINAL CLINICAL IMPRESSION(S) / ED DIAGNOSES  Final diagnoses:  Altered mental status, unspecified altered mental status type  Urinary tract infection with hematuria, site unspecified      NEW MEDICATIONS STARTED DURING THIS VISIT:  New Prescriptions   No medications on file     Note:  This document was prepared using Dragon voice recognition software and may include unintentional dictation errors.    Arnaldo Natal, MD 10/21/16 [phone]

## 2016-10-21 NOTE — ED Triage Notes (Signed)
Pt to ED via EMS from asst living facility [facility] with c/o increased confusion since fall on 8/24. [facility] was evaluated at [facility] with no diagnosis. Per facililty pt continues to become more altered than usual. Per EMS pt 89% on RA and HR 120s. Pt A&O to self. MD at bedside

## 2016-10-21 NOTE — H&P (Addendum)
[facility] [place] at [facility]   PATIENT NAME: Tina Ellison    MR#:  [id number]  DATE OF BIRTH:  April 18, 1945  DATE OF ADMISSION:  10/21/2016  PRIMARY CARE PHYSICIAN: Duffy Rhody, FNP   REQUESTING/REFERRING PHYSICIAN: Marge Duncans, M.D.  CHIEF COMPLAINT:   Altered mental status HISTORY OF PRESENT ILLNESS:  Tina Ellison  is a 71 y.o. female with a known history of COPD, GERD, hypothyroidism, schizoaffective disorder, schizophrenia and multiple other medical problems is brought into the ED from assisted living facility for altered mental status According to the EMS report patient fell on 24th of August and was seen at [facility], at that time CT head was negative and was discharged on August 27 but her mental status is deteriorating and falling asleep a lot.In the ED patient is very lethargic arousable and answers 1 or 2 questions and falls asleep. D dimers are elevated and CT angiogram with no pulmonary embolism   PAST MEDICAL HISTORY:   Past Medical History:  Diagnosis Date  . Anxiety   . Anxiety disorder   . COPD (chronic obstructive pulmonary disease) (HCC)   . Depression   . GERD (gastroesophageal reflux disease)   . Hypothyroidism   . Memory loss   . Osteoporosis   . Overactive bladder   . Schizoaffective disorder (HCC)   . Schizophrenia (HCC)   . Thrombocytopenia (HCC)   . Urinary retention   . UTI (lower urinary tract infection)   . Vitamin D deficiency   . Vitamin deficiency   . Weakness     PAST SURGICAL HISTOIRY:   Past Surgical History:  Procedure Laterality Date  . BREAST SURGERY Right     SOCIAL HISTORY:   Social History  Substance Use Topics  . Smoking status: Current Every Day Smoker    Packs/day: 0.50    Types: Cigarettes  . Smokeless tobacco: Never Used  . Alcohol use No    FAMILY HISTORY:  No family history on file.  DRUG ALLERGIES:   Allergies  Allergen Reactions  . Haldol [Haloperidol]      Patient reported-states dry mouth. No allergies are listed on MAR    REVIEW OF SYSTEMS:  Review of system is Unobtainable as the patient is very lethargic  MEDICATIONS AT HOME:   Prior to Admission medications   Medication Sig Start Date End Date Taking? Authorizing Provider  alendronate (FOSAMAX) 70 MG tablet Take 70 mg by mouth once a week. Take with a full glass of water on an empty stomach. Every Monday.   Yes [provider]  atorvastatin (LIPITOR) 10 MG tablet Take 10 mg by mouth every evening.   Yes [provider]  benztropine (COGENTIN) 1 MG tablet Take 1 mg by mouth 2 (two) times daily.   Yes [provider]  cholecalciferol (VITAMIN D) 1000 units tablet Take 1,000 Units by mouth daily.   Yes [provider]  divalproex (DEPAKOTE ER) 500 MG 24 hr tablet Take 500 mg by mouth 2 (two) times daily.    Yes [provider]  docusate sodium (COLACE) 100 MG capsule Take 100 mg by mouth 2 (two) times daily.   Yes [provider]  folic acid (FOLVITE) 400 MCG tablet Take 400 mcg by mouth daily.   Yes [provider]  haloperidol (HALDOL) 10 MG tablet Take 10 mg by mouth 2 (two) times daily.   Yes [provider]  levothyroxine (SYNTHROID, LEVOTHROID) 50 MCG tablet Take 50 mcg  by mouth daily before breakfast.   Yes [provider]  Melatonin 3 MG TABS Take 3 mg by mouth at bedtime.   Yes [provider]  Multiple Vitamin (MULTIVITAMIN WITH MINERALS) TABS tablet Take 1 tablet by mouth daily.   Yes [provider]  OLANZapine (ZYPREXA) 20 MG tablet Take 20 mg by mouth daily.   Yes [provider]  omeprazole (PRILOSEC) 20 MG capsule Take 20 mg by mouth daily.   Yes [provider]  oxybutynin (DITROPAN) 5 MG tablet Take 5 mg by mouth daily.   Yes [provider]  traZODone (DESYREL) 50 MG tablet Take 25-50 mg by mouth 2 (two) times daily. *Take one tablet daily in the  morning and one-half tablet daily at bedtime   Yes [provider]      VITAL SIGNS:  Blood pressure 108/84, pulse (!) 109, temperature 98.8 F (37.1 C), temperature source Oral, resp. rate 20, SpO2 99 %.  PHYSICAL EXAMINATION:  GENERAL:  71 y.o.-year-old patient lying in the bed with no acute distress.  EYES: Pupils equal, round, reactive to light and accommodation. No scleral icterus. Extraocular muscles intact.  HEENT: Head atraumatic, normocephalic. Oropharynx and nasopharynx clear.  NECK:  Supple, no jugular venous distention. No thyroid enlargement, no tenderness.  LUNGS: Normal breath sounds bilaterally, no wheezing, rales,rhonchi or crepitation. No use of accessory muscles of respiration.  CARDIOVASCULAR: S1, S2 normal. No murmurs, rubs, or gallops.  ABDOMEN: Soft, nontender, nondistended. Bowel sounds present. No organomegaly or mass.  EXTREMITIES: positive calf tenderness No pedal edema, cyanosis, or clubbing.  NEUROLOGIC: delirious but arousable  PSYCHIATRIC: The patient is  delirious but arousable and falling asleep SKIN: No obvious rash, lesion, or ulcer.   LABORATORY PANEL:   CBC  Recent Labs Lab 10/21/16 1131  WBC 16.4*  HGB 14.6  HCT 43.3  PLT 324   ------------------------------------------------------------------------------------------------------------------  Chemistries   Recent Labs Lab 10/21/16 1131  NA 138  K 4.3  CL 99*  CO2 30  GLUCOSE 151*  BUN 16  CREATININE 0.97  CALCIUM 8.6*  AST 15  ALT 13*  ALKPHOS 96  BILITOT 0.4   ------------------------------------------------------------------------------------------------------------------  Cardiac Enzymes  Recent Labs Lab 10/21/16 1131  TROPONINI <0.03   ------------------------------------------------------------------------------------------------------------------  RADIOLOGY:  Ct Head Wo Contrast  Result Date: 10/21/2016 CLINICAL DATA:  Increased confusion since a  fall on 10/12/2016. EXAM: CT HEAD WITHOUT CONTRAST TECHNIQUE: Contiguous axial images were obtained from the base of the skull through the vertex without intravenous contrast. COMPARISON:  10/12/2016. FINDINGS: Brain: Diffusely enlarged ventricles and subarachnoid spaces. Patchy white matter low density in both cerebral hemispheres. No intracranial hemorrhage, mass lesion or CT evidence of acute infarction. Vascular: No hyperdense vessel or unexpected calcification. Skull: Normal. Negative for fracture or focal lesion. Sinuses/Orbits: Unremarkable. Other: None. IMPRESSION: 1. No acute abnormality. 2. Stable atrophy and chronic small vessel white matter ischemic changes. Electronically Signed   By: Beckie SaltsSteven  Reid M.D.   On: 10/21/2016 12:39   Ct Angio Chest Pe W And/or Wo Contrast  Result Date: 10/21/2016 CLINICAL DATA:  71 year old female with increased confusion after a fall on 10/12/2016. Hypoxia and abnormal D-dimer. EXAM: CT ANGIOGRAPHY CHEST WITH CONTRAST TECHNIQUE: Multidetector CT imaging of the chest was performed using the standard protocol during bolus administration of intravenous contrast. Multiplanar CT image reconstructions and MIPs were obtained to evaluate the vascular anatomy. CONTRAST:  75 mL Isovue 370 COMPARISON:  Chest radiographs 1221 hours today and earlier. CT cervical spine 10/12/2016,  04/06/2014 FINDINGS: Cardiovascular: Good contrast bolus timing in the pulmonary arterial tree. Mild respiratory motion artifact. No focal filling defect identified in the pulmonary arteries to suggest acute pulmonary embolism. Negative visible aorta aside from mild atherosclerosis. Calcified coronary artery atherosclerosis is evident. No cardiomegaly or pericardial effusion. Mediastinum/Nodes: Negative.  No lymphadenopathy. Lungs/Pleura: Major airways are patent. There is peripheral/dependent opacity in the superior segment of the right lower lobe with mild associated peribronchial thickening. No definite  consolidation. No pleural effusion. Minimal bilateral dependent atelectasis otherwise. Minimal scarring or atelectasis in the lingula. Upper Abdomen: Negative visualized liver, gallbladder, spleen, pancreas, adrenal glands and bowel in the upper abdomen. Probable left hydronephrosis (series 4, image 96). Musculoskeletal: Mild to moderate compression fractures of the anterior T1 and superior T2 vertebral bodies are new since 2016 and likely subacute (series 8, image 53). The anterior T1 body fracture is comminuted. No retropulsion, and the pedicles and posterior elements of both levels appear intact. Underlying osteopenia. Healed chronic low or sternal fracture. No other acute osseous abnormality identified. Review of the MIP images confirms the above findings. IMPRESSION: 1.  Negative for acute pulmonary embolus. 2. Subacute T1 and T2 compression fractures. No retropulsion of bone or complicating features. 3. Nonspecific peripheral opacity in the superior segment of the right lower lobe may reflect atelectasis but developing infection is difficult to exclude. No pleural effusion. 4. Suggestion of Left Hydronephrosis. Renal Ultrasound may be the simplest way to follow up this finding, or alternatively CT Abdomen and Pelvis. 5. Calcified coronary artery atherosclerosis. Mild for age thoracic aortic atherosclerosis. Electronically Signed   By: Odessa Fleming M.D.   On: 10/21/2016 14:56   Dg Chest Portable 1 View  Result Date: 10/21/2016 CLINICAL DATA:  Increased confusion since a fall on 10/12/2016. Smoker. EXAM: PORTABLE CHEST 1 VIEW COMPARISON:  10/15/2016. FINDINGS: Normal sized heart. Clear lungs. Diffuse osteopenia. 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 IMPRESSION: No acute abnormality. Electronically Signed   By: Beckie Salts M.D.   On: 10/21/2016 12:36    EKG:   Orders placed or performed during the hospital encounter of 10/21/16  . ED EKG  . ED EKG    IMPRESSION AND PLAN:   Autumm Hattery  is a 71 y.o.  female with a known history of COPD, GERD, hypothyroidism, schizoaffective disorder, schizophrenia and multiple other medical problems is brought into the ED from assisted living facility for altered mental status According to the EMS report patient fell on 24th of August and was seen at [facility], at that time CT head was negative and was discharged on August 27 but her mental status is deteriorating and falling asleep a lot.  #altered mental status secondary to sepsis Admit to MedSurg unit Neuro checks Pancultures Broad-spectrum IV antibiotics and IV fluids Patient meets septic criteria at the time of admission with leukocytosis and tachycardia Check lactate levels Currently nothing by mouth except for meds Aspiration precautions Holding home medications Haldol and trazodone CT head is negative  #Sepsis secondary to healthcare associated pneumonia and possible UTI Pancultures ordered including urine culture and sensitivity Broad-spectrum IV antibiotics Zosyn and vancomycin, De-escalate antibiotics based on the culture results IV fluids, supportive treatment Follow-up in the lactic acid levels  #elevated d-dimer could be from the underlying sepsis CT angiogram is negative for pulmonary embolism Patient has calf tenderness-will get bilateral lower extending to venous Dopplers to rule out DVT  #Left hydronephrosis on CT angiogram of the chest Renal ultrasound is ordered, will consult urology if needed  Foley catheter placed to monitor urine output  #Subacute T1 and T2 compression fracture Consult orthopedics  #chronic history of hypothyroidism continue Synthroid ; TSH is normal  DVT prophylaxis with Lovenox subcutaneous  All the records are reviewed and case discussed with ED provider. Management plans discussed with the patient's facility caregiver Harriett Sine. Family members are unavailable  CODE STATUS: FC until CODE STATUS is determined  TOTAL TIME TAKING CARE OF THIS  PATIENT: 45 minutes.   Note: This dictation was prepared with Dragon dictation along with smaller phrase technology. Any transcriptional errors that result from this process are unintentional.  Ramonita Lab M.D on 10/21/2016 at 3:58 PM  Between 7am to 6pm - Pager - [phone]  After 6pm go to [email or web address] - password [email or web address] [facility]  [facility]  Office  [phone]  CC: Primary care physician; Duffy Rhody, FNP

## 2016-10-21 NOTE — Progress Notes (Signed)
ANTIBIOTIC CONSULT NOTE - INITIAL  Pharmacy Consult for Vancomycin  Indication: sepsis  Allergies  Allergen Reactions  . Haldol [Haloperidol]     Patient reported-states dry mouth. No allergies are listed on [facility]    Patient Measurements: Weight: 132 lb 8 oz (60.1 kg) Adjusted Body Weight: 62.4 kg   Vital Signs: Temp: 98.8 F (37.1 C) (09/02 1726) Temp Source: Oral (09/02 1726) BP: 120/57 (09/02 1726) Pulse Rate: 112 (09/02 1726) Intake/Output from previous day: No intake/output data recorded. Intake/Output from this shift: No intake/output data recorded.  Labs:  Recent Labs  10/21/16 1131  WBC 16.4*  HGB 14.6  PLT 324  CREATININE 0.97   Estimated Creatinine Clearance: 51.9 mL/min (by C-G formula based on SCr of 0.97 mg/dL). No results for input(s): VANCOTROUGH, VANCOPEAK, VANCORANDOM, GENTTROUGH, GENTPEAK, GENTRANDOM, TOBRATROUGH, TOBRAPEAK, TOBRARND, AMIKACINPEAK, AMIKACINTROU, AMIKACIN in the last 72 hours.   Microbiology: No results found for this or any previous visit (from the past 720 hour(s)).  Medical History: Past Medical History:  Diagnosis Date  . Anxiety   . Anxiety disorder   . COPD (chronic obstructive pulmonary disease) (HCC)   . Depression   . GERD (gastroesophageal reflux disease)   . Hypothyroidism   . Memory loss   . Osteoporosis   . Overactive bladder   . Schizoaffective disorder (HCC)   . Schizophrenia (HCC)   . Thrombocytopenia (HCC)   . Urinary retention   . UTI (lower urinary tract infection)   . Vitamin D deficiency   . Vitamin deficiency   . Weakness     Medications:  Prescriptions Prior to Admission  Medication Sig Dispense Refill Last Dose  . alendronate (FOSAMAX) 70 MG tablet Take 70 mg by mouth once a week. Take with a full glass of water on an empty stomach. Every Monday.   10/15/2016 at 0700  . atorvastatin (LIPITOR) 10 MG tablet Take 10 mg by mouth every evening.   10/20/2016 at 2000  . benztropine (COGENTIN) 1 MG  tablet Take 1 mg by mouth 2 (two) times daily.   10/21/2016 at 0800  . cholecalciferol (VITAMIN D) 1000 units tablet Take 1,000 Units by mouth daily.   10/21/2016 at 0800  . divalproex (DEPAKOTE ER) 500 MG 24 hr tablet Take 500 mg by mouth 2 (two) times daily.    10/21/2016 at 0800  . docusate sodium (COLACE) 100 MG capsule Take 100 mg by mouth 2 (two) times daily.   10/21/2016 at 0800  . folic acid (FOLVITE) 400 MCG tablet Take 400 mcg by mouth daily.   10/21/2016 at 0800  . haloperidol (HALDOL) 10 MG tablet Take 10 mg by mouth 2 (two) times daily.   10/21/2016 at 0800  . levothyroxine (SYNTHROID, LEVOTHROID) 50 MCG tablet Take 50 mcg by mouth daily before breakfast.   10/21/2016 at 0800  . Melatonin 3 MG TABS Take 3 mg by mouth at bedtime.   10/20/2016 at 2000  . Multiple Vitamin (MULTIVITAMIN WITH MINERALS) TABS tablet Take 1 tablet by mouth daily.   10/21/2016 at 0800  . OLANZapine (ZYPREXA) 20 MG tablet Take 20 mg by mouth daily.   10/21/2016 at 0800  . omeprazole (PRILOSEC) 20 MG capsule Take 20 mg by mouth daily.   10/21/2016 at 0800  . oxybutynin (DITROPAN) 5 MG tablet Take 5 mg by mouth daily.   10/21/2016 at 0800  . traZODone (DESYREL) 50 MG tablet Take 25-50 mg by mouth 2 (two) times daily. *Take one tablet daily in the morning  and one-half tablet daily at bedtime   10/21/2016 at 0800   Assessment: CrCl = 51.9 ml/min Ke = 0.047 hr-1 T1/2 = 14.7 hrs Vd = 42.1 L   Goal of Therapy:  Vancomycin trough level 15-20 mcg/ml  Plan:  Expected duration 7 days with resolution of temperature and/or normalization of WBC   Vancomycin 750 mg IV X 1 ordered to be given on 9/2 @ 19:00. Vancomycin 750 mg IV Q12H ordered to start on 9/3 @ 0100, ~ 6 hrs after 1st dose (stacked dosing).  This pt will reach Css on 9/5 @ 19:00. Will draw 1st trough on 9/5 @ 12:30, which will be very close to Css.   Domanique Huesman D 10/21/2016,6:03 PM

## 2016-10-21 NOTE — Consult Note (Signed)
Subjective: CC: Hydronephrosis.  Hx: Ms. Leming is a 71 yo WF who I was asked to see in consultation by Dr. Amado Coe for hydronephrosis.   Ms. Such was admitted through the ER for MS changes with a cough and concern for sepsis with pneumonia.  She had a CT angio that showed left hydro.  A subsequent renal US confirmed bilateral hydronephrosis.  I asked that a CT be done which shows bilateral hydro right > left with ureteral dilation to a very thickened bladder.  A foley has been ordered but placement is pending.   The patient is oriented to person only but denies abdominal pain or voiding symptoms but she has a history of incontinence and urinary retention is mentioned in the history.  Her UA has TNTC WBC and RBC's.  She has had proteus on a prior culture in May.    ROS:  Review of Systems  Unable to perform ROS: Mental status change    Allergies  Allergen Reactions  . Haldol [Haloperidol]     Patient reported-states dry mouth. No allergies are listed on [facility]    Past Medical History:  Diagnosis Date  . Anxiety   . Anxiety disorder   . COPD (chronic obstructive pulmonary disease) (HCC)   . Depression   . GERD (gastroesophageal reflux disease)   . Hypothyroidism   . Memory loss   . Osteoporosis   . Overactive bladder   . Schizoaffective disorder (HCC)   . Schizophrenia (HCC)   . Thrombocytopenia (HCC)   . Urinary retention   . UTI (lower urinary tract infection)   . Vitamin D deficiency   . Vitamin deficiency   . Weakness     Past Surgical History:  Procedure Laterality Date  . BREAST SURGERY Right     Social History   Social History  . Marital status: Single    Spouse name: N/A  . Number of children: N/A  . Years of education: N/A   Occupational History  . Not on file.   Social History Main Topics  . Smoking status: Current Every Day Smoker    Packs/day: 0.50    Types: Cigarettes  . Smokeless tobacco: Never Used  . Alcohol use No  . Drug use: No  . Sexual  activity: Not on file   Other Topics Concern  . Not on file   Social History Narrative  . No narrative on file    History reviewed. No pertinent family history.   I have reviewed her medical, surgical, social and family history.   Anti-infectives: Anti-infectives    Start     Dose/Rate Route Frequency Ordered Stop   10/22/16 0100  vancomycin (VANCOCIN) IVPB 750 mg/150 ml premix     750 mg 150 mL/hr over 60 Minutes Intravenous Every 12 hours 10/21/16 1802     10/21/16 1900  vancomycin (VANCOCIN) IVPB 750 mg/150 ml premix     750 mg 150 mL/hr over 60 Minutes Intravenous  Once 10/21/16 1801     10/21/16 1900  piperacillin-tazobactam (ZOSYN) IVPB 3.375 g     3.375 g 12.5 mL/hr over 240 Minutes Intravenous Every 8 hours 10/21/16 1831     10/21/16 1615  piperacillin-tazobactam (ZOSYN) IVPB 3.375 g  Status:  Discontinued     3.375 g 12.5 mL/hr over 240 Minutes Intravenous  Once 10/21/16 1608 10/21/16 1830   10/21/16 1430  cefTRIAXone (ROCEPHIN) 1 g in dextrose 5 % 50 mL IVPB     1 g 100 mL/hr over 30  Minutes Intravenous  Once 10/21/16 1416 10/21/16 1448      Current Facility-Administered Medications  Medication Dose Route Frequency Provider Last Rate Last Dose  . 0.9 %  sodium chloride infusion   Intravenous Continuous Gouru, Aruna, MD      . acetaminophen (TYLENOL) tablet 650 mg  650 mg Oral Q6H PRN Gouru, Aruna, MD       Or  . acetaminophen (TYLENOL) suppository 650 mg  650 mg Rectal Q6H PRN Gouru, Aruna, MD      . atorvastatin (LIPITOR) tablet 10 mg  10 mg Oral QPM Gouru, Aruna, MD      . benztropine (COGENTIN) tablet 1 mg  1 mg Oral BID Gouru, Aruna, MD      . enoxaparin (LOVENOX) injection 40 mg  40 mg Subcutaneous Q24H Gouru, Aruna, MD      . Melene Muller ON 10/22/2016] levothyroxine (SYNTHROID, LEVOTHROID) tablet 50 mcg  50 mcg Oral QAC breakfast Gouru, Aruna, MD      . Melene Muller ON 10/22/2016] OLANZapine (ZYPREXA) tablet 20 mg  20 mg Oral Daily Gouru, Aruna, MD      . ondansetron  (ZOFRAN) tablet 4 mg  4 mg Oral Q6H PRN Gouru, Aruna, MD       Or  . ondansetron (ZOFRAN) injection 4 mg  4 mg Intravenous Q6H PRN Gouru, Aruna, MD      . Melene Muller ON 10/22/2016] oxybutynin (DITROPAN) tablet 5 mg  5 mg Oral Daily Gouru, Aruna, MD      . piperacillin-tazobactam (ZOSYN) IVPB 3.375 g  3.375 g Intravenous Q8H Gouru, Aruna, MD      . vancomycin (VANCOCIN) IVPB 750 mg/150 ml premix  750 mg Intravenous Once Gouru, Deanna Artis, MD      . Melene Muller ON 10/22/2016] vancomycin (VANCOCIN) IVPB 750 mg/150 ml premix  750 mg Intravenous Q12H Gouru, Aruna, MD         Objective: Vital signs in last 24 hours: Temp:  [98.8 F (37.1 C)] 98.8 F (37.1 C) (09/02 1726) Pulse Rate:  [107-116] 112 (09/02 1726) Resp:  [14-20] 20 (09/02 1726) BP: (99-120)/(57-84) 120/57 (09/02 1726) SpO2:  [92 %-100 %] 95 % (09/02 1726) Weight:  [60.1 kg (132 lb 8 oz)] 60.1 kg (132 lb 8 oz) (09/02 1726)  Intake/Output from previous day: No intake/output data recorded. Intake/Output this shift: No intake/output data recorded.   Physical Exam  Constitutional:  Thin edentulous WF in NAD.   She looks older than stated age.   HENT:  Head: Normocephalic and atraumatic.  Neck: Normal range of motion. Neck supple. No thyromegaly present.  Cardiovascular:  Sinus tachycardia without murmur.   Pulmonary/Chest: Effort normal and breath sounds normal. No respiratory distress.  Abdominal: Soft. She exhibits no distension and no mass. There is tenderness (suprapubic and left upper quadrant). There is no guarding.  Musculoskeletal: Normal range of motion. She exhibits no edema or tenderness.  Lymphadenopathy:    She has no cervical adenopathy.       Right: No inguinal and no supraclavicular adenopathy present.       Left: No inguinal and no supraclavicular adenopathy present.  Neurological:  A/O x 1.  No focal deficits.   Skin: Skin is warm and dry.  Vitals reviewed.   Lab Results:   Recent Labs  10/21/16 1131  WBC 16.4*   HGB 14.6  HCT 43.3  PLT 324   BMET  Recent Labs  10/21/16 1131  NA 138  K 4.3  CL 99*  CO2 30  GLUCOSE 151*  BUN 16  CREATININE 0.97  CALCIUM 8.6*   PT/INR No results for input(s): LABPROT, INR in the last 72 hours. ABG No results for input(s): PHART, HCO3 in the last 72 hours.  Invalid input(s): PCO2, PO2  Studies/Results: Ct Head Wo Contrast  Result Date: 10/21/2016 CLINICAL DATA:  Increased confusion since a fall on 10/12/2016. EXAM: CT HEAD WITHOUT CONTRAST TECHNIQUE: Contiguous axial images were obtained from the base of the skull through the vertex without intravenous contrast. COMPARISON:  10/12/2016. FINDINGS: Brain: Diffusely enlarged ventricles and subarachnoid spaces. Patchy white matter low density in both cerebral hemispheres. No intracranial hemorrhage, mass lesion or CT evidence of acute infarction. Vascular: No hyperdense vessel or unexpected calcification. Skull: Normal. Negative for fracture or focal lesion. Sinuses/Orbits: Unremarkable. Other: None. IMPRESSION: 1. No acute abnormality. 2. Stable atrophy and chronic small vessel white matter ischemic changes. Electronically Signed   By: Beckie SaltsSteven  Reid M.D.   On: 10/21/2016 12:39   Ct Angio Chest Pe W And/or Wo Contrast  Result Date: 10/21/2016 CLINICAL DATA:  71 year old female with increased confusion after a fall on 10/12/2016. Hypoxia and abnormal D-dimer. EXAM: CT ANGIOGRAPHY CHEST WITH CONTRAST TECHNIQUE: Multidetector CT imaging of the chest was performed using the standard protocol during bolus administration of intravenous contrast. Multiplanar CT image reconstructions and MIPs were obtained to evaluate the vascular anatomy. CONTRAST:  75 mL Isovue 370 COMPARISON:  Chest radiographs 1221 hours today and earlier. CT cervical spine 10/12/2016, 04/06/2014 FINDINGS: Cardiovascular: Good contrast bolus timing in the pulmonary arterial tree. Mild respiratory motion artifact. No focal filling defect identified in the  pulmonary arteries to suggest acute pulmonary embolism. Negative visible aorta aside from mild atherosclerosis. Calcified coronary artery atherosclerosis is evident. No cardiomegaly or pericardial effusion. Mediastinum/Nodes: Negative.  No lymphadenopathy. Lungs/Pleura: Major airways are patent. There is peripheral/dependent opacity in the superior segment of the right lower lobe with mild associated peribronchial thickening. No definite consolidation. No pleural effusion. Minimal bilateral dependent atelectasis otherwise. Minimal scarring or atelectasis in the lingula. Upper Abdomen: Negative visualized liver, gallbladder, spleen, pancreas, adrenal glands and bowel in the upper abdomen. Probable left hydronephrosis (series 4, image 96). Musculoskeletal: Mild to moderate compression fractures of the anterior T1 and superior T2 vertebral bodies are new since 2016 and likely subacute (series 8, image 53). The anterior T1 body fracture is comminuted. No retropulsion, and the pedicles and posterior elements of both levels appear intact. Underlying osteopenia. Healed chronic low or sternal fracture. No other acute osseous abnormality identified. Review of the MIP images confirms the above findings. IMPRESSION: 1.  Negative for acute pulmonary embolus. 2. Subacute T1 and T2 compression fractures. No retropulsion of bone or complicating features. 3. Nonspecific peripheral opacity in the superior segment of the right lower lobe may reflect atelectasis but developing infection is difficult to exclude. No pleural effusion. 4. Suggestion of Left Hydronephrosis. Renal Ultrasound may be the simplest way to follow up this finding, or alternatively CT Abdomen and Pelvis. 5. Calcified coronary artery atherosclerosis. Mild for age thoracic aortic atherosclerosis. Electronically Signed   By: Odessa FlemingH  Hall M.D.   On: 10/21/2016 14:56   [place] Retroperitoneal (renal,aorta,ivc Nodes)  Result Date: 10/21/2016 CLINICAL DATA:  Hydronephrosis seen  on chest CT EXAM: RENAL / URINARY TRACT ULTRASOUND COMPLETE COMPARISON:  10/21/2016 chest CT FINDINGS: Right Kidney: Length: 10.4 cm. Echogenicity within normal limits. No mass or calculus. Mild hydronephrosis without obstructing source identified. Findings could potentially represent chronic caliectasis. Left Kidney: Length: 8.6 cm. Echogenicity within  normal limits. No mass or calculus. Mild hydronephrosis is noted without obstructing etiology. Findings could represent chronic caliectasis given what appear to be ureteral jets demonstrated in the bladder. Bladder: Partially distended with ureteral jets seen. There appears to be slight thickening likely due to underdistention. Internal debris is not excluded. IMPRESSION: 1. Bilateral mild hydronephrosis or caliectasis of the renal collecting systems. 2. Nondistended bladder with slight mural thickening and/or debris along its dependent aspect, some which is likely due to underdistention. Electronically Signed   By: Tollie Eth M.D.   On: 10/21/2016 17:09   US Venous Img Lower Bilateral  Result Date: 10/21/2016 CLINICAL DATA:  71 year old female with acute bilateral lower extremity pain. EXAM: BILATERAL LOWER EXTREMITY VENOUS DOPPLER ULTRASOUND TECHNIQUE: Gray-scale sonography with graded compression, as well as color Doppler and duplex ultrasound were performed to evaluate the lower extremity deep venous systems from the level of the common femoral vein and including the common femoral, femoral, profunda femoral, popliteal and calf veins including the posterior tibial, peroneal and gastrocnemius veins when visible. The superficial great saphenous vein was also interrogated. Spectral Doppler was utilized to evaluate flow at rest and with distal augmentation maneuvers in the common femoral, femoral and popliteal veins. COMPARISON:  None. FINDINGS: RIGHT LOWER EXTREMITY Normal flow, compressibility, and augmentation within the distal common femoral, proximal profunda  femoral, proximal greater saphenous, entire femoral, popliteal veins, and imaged calf veins. LEFT LOWER EXTREMITY Normal flow, compressibility, and augmentation within the distal common femoral, proximal profunda femoral, proximal greater saphenous, entire femoral, popliteal veins, and imaged calf veins. IMPRESSION: No evidence of DVT within either lower extremity. Electronically Signed   By: Harmon Pier M.D.   On: 10/21/2016 17:04   Dg Chest Portable 1 View  Result Date: 10/21/2016 CLINICAL DATA:  Increased confusion since a fall on 10/12/2016. Smoker. EXAM: PORTABLE CHEST 1 VIEW COMPARISON:  10/15/2016. FINDINGS: Normal sized heart. Clear lungs. Diffuse osteopenia. 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 IMPRESSION: No acute abnormality. Electronically Signed   By: Beckie Salts M.D.   On: 10/21/2016 12:36   Ct Renal Stone Study  Result Date: 10/21/2016 CLINICAL DATA:  Hydronephrosis on chest CTA EXAM: CT ABDOMEN AND PELVIS WITHOUT CONTRAST TECHNIQUE: Multidetector CT imaging of the abdomen and pelvis was performed following the standard protocol without IV contrast. COMPARISON:  None. FINDINGS: Lower chest: Mild basilar atelectasis Hepatobiliary: No focal hepatic lesion. No biliary duct dilatation. Gallbladder is normal. Common bile duct is normal. Pancreas: Pancreas is normal. No ductal dilatation. No pancreatic inflammation. Spleen: Normal spleen Adrenals/urinary tract: Adrenal glands normal. There is mild bilateral pelvicaliectasis. The LEFT and RIGHT ureter are nonobstructed with contrast excreted into the bladder on delayed imaging. There is suggestive of debris or potential endoluminal filling defect within the distal RIGHT ureter through the pelvic brim (image 53, series 6). Several additional foci of potential debris within the RIGHT renal pelvis. Stomach/Bowel: Stomach, small bowel, appendix, and cecum are normal. The colon and rectosigmoid colon are normal. Vascular/Lymphatic: Abdominal aorta is  normal caliber. There is no retroperitoneal or periportal lymphadenopathy. No pelvic lymphadenopathy. Reproductive: Uterus normal. Other: No free fluid. Musculoskeletal: Bilateral hip prosthetics does generates significant streak artifact the pelvis. IMPRESSION: 1. No evidence of high-grade renal obstruction. 2. Bilateral pelvicaliectasis. 3. Subtle Filling defects within the RIGHT ureter which could represent debris, lesion or calculus. No obstruction. Recommend either dedicated hematuria protocol for evaluation of RIGHT ureter versus retrograde pyelogram. Electronically Signed   By: Genevive Bi M.D.   On: 10/21/2016 19:36  I have spoken to Dr. Amado Coe and reviewed her CT films and reports, [place] films and report and labs as well as prior notes in [facility].   Assessment: Bilateral hydro secondary to a thickened bladder wall with possible retention and chronic infection.  She needs foley catheter drainage with repeat US in 1-2 days to assess for improvement in the hydronephrosis.  I don't anticipate and need for stents unless condition does not improve with foley drainage.     Sepsis from either a pulmonary or urinary source.     CC: Dr. Ramonita Lab                  Bjorn Pippin J 10/21/2016 [phone]

## 2016-10-21 NOTE — Consult Note (Signed)
ORTHOPAEDIC CONSULTATION  PATIENT NAME: Tina Ellison DOB: [birth year]  MRN: [id number]  REQUESTING PHYSICIAN: Delfino Lovett, MD  Chief Complaint: altered mental status, T1 and T2 fracture  HPI: Tina Ellison is a 71 y.o. female with a known history of COPD, gastroesophageal reflux disease, hypothyroidism, Shizoaffective disorder, schizophrenia and multiple other medical problems. Patient is currently a resident at an assisted living facility. Patient was brought to the ER by EMS with complaints of altered mental status. Patient apparently had a fall on August 24 and was taken to [facility]. CT of the head done at the previous hospital was negative. CT of the chest with angiogram hair at [facility] showed evidence of a  T1 and T2 fracture with unknown chronicity. The orthopedic service has been consulted.  Past Medical History:  Diagnosis Date  . Anxiety   . Anxiety disorder   . COPD (chronic obstructive pulmonary disease) (HCC)   . Depression   . GERD (gastroesophageal reflux disease)   . Hypothyroidism   . Memory loss   . Osteoporosis   . Overactive bladder   . Schizoaffective disorder (HCC)   . Schizophrenia (HCC)   . Thrombocytopenia (HCC)   . Urinary retention   . UTI (lower urinary tract infection)   . Vitamin D deficiency   . Vitamin deficiency   . Weakness    Past Surgical History:  Procedure Laterality Date  . BREAST SURGERY Right    Social History   Social History  . Marital status: Single    Spouse name: N/A  . Number of children: N/A  . Years of education: N/A   Social History Main Topics  . Smoking status: Current Every Day Smoker    Packs/day: 0.50    Types: Cigarettes  . Smokeless tobacco: Never Used  . Alcohol use No  . Drug use: No  . Sexual activity: Not Asked   Other Topics Concern  . None   Social History Narrative  . None   History reviewed. No pertinent family history. Allergies  Allergen Reactions  .  Haldol [Haloperidol]     Patient reported-states dry mouth. No allergies are listed on MAR   Prior to Admission medications   Medication Sig Start Date End Date Taking? Authorizing Provider  alendronate (FOSAMAX) 70 MG tablet Take 70 mg by mouth once a week. Take with a full glass of water on an empty stomach. Every Monday.   Yes [provider]  atorvastatin (LIPITOR) 10 MG tablet Take 10 mg by mouth every evening.   Yes [provider]  benztropine (COGENTIN) 1 MG tablet Take 1 mg by mouth 2 (two) times daily.   Yes [provider]  cholecalciferol (VITAMIN D) 1000 units tablet Take 1,000 Units by mouth daily.   Yes [provider]  divalproex (DEPAKOTE ER) 500 MG 24 hr tablet Take 500 mg by mouth 2 (two) times daily.    Yes [provider]  docusate sodium (COLACE) 100 MG capsule Take 100 mg by mouth 2 (two) times daily.   Yes [provider]  folic acid (FOLVITE) 400 MCG tablet Take 400 mcg by mouth daily.   Yes [provider]  haloperidol (HALDOL) 10 MG tablet Take 10 mg by mouth 2 (two) times daily.   Yes [provider]  levothyroxine (SYNTHROID, LEVOTHROID) 50 MCG tablet Take 50 mcg by mouth daily before breakfast.   Yes [provider]  Melatonin 3 MG TABS Take 3 mg by  mouth at bedtime.   Yes [provider]  Multiple Vitamin (MULTIVITAMIN WITH MINERALS) TABS tablet Take 1 tablet by mouth daily.   Yes [provider]  OLANZapine (ZYPREXA) 20 MG tablet Take 20 mg by mouth daily.   Yes [provider]  omeprazole (PRILOSEC) 20 MG capsule Take 20 mg by mouth daily.   Yes [provider]  oxybutynin (DITROPAN) 5 MG tablet Take 5 mg by mouth daily.   Yes [provider]  traZODone (DESYREL) 50 MG tablet Take 25-50 mg by mouth 2 (two) times daily. *Take one tablet daily in the morning and one-half tablet daily at bedtime   Yes [provider]   Ct Head Wo  Contrast  Result Date: 10/21/2016 CLINICAL DATA:  Increased confusion since a fall on 10/12/2016. EXAM: CT HEAD WITHOUT CONTRAST TECHNIQUE: Contiguous axial images were obtained from the base of the skull through the vertex without intravenous contrast. COMPARISON:  10/12/2016. FINDINGS: Brain: Diffusely enlarged ventricles and subarachnoid spaces. Patchy white matter low density in both cerebral hemispheres. No intracranial hemorrhage, mass lesion or CT evidence of acute infarction. Vascular: No hyperdense vessel or unexpected calcification. Skull: Normal. Negative for fracture or focal lesion. Sinuses/Orbits: Unremarkable. Other: None. IMPRESSION: 1. No acute abnormality. 2. Stable atrophy and chronic small vessel white matter ischemic changes. Electronically Signed   By: Beckie Salts M.D.   On: 10/21/2016 12:39   Ct Angio Chest Pe W And/or Wo Contrast  Result Date: 10/21/2016 CLINICAL DATA:  71 year old female with increased confusion after a fall on 10/12/2016. Hypoxia and abnormal D-dimer. EXAM: CT ANGIOGRAPHY CHEST WITH CONTRAST TECHNIQUE: Multidetector CT imaging of the chest was performed using the standard protocol during bolus administration of intravenous contrast. Multiplanar CT image reconstructions and MIPs were obtained to evaluate the vascular anatomy. CONTRAST:  75 mL Isovue 370 COMPARISON:  Chest radiographs 1221 hours today and earlier. CT cervical spine 10/12/2016, 04/06/2014 FINDINGS: Cardiovascular: Good contrast bolus timing in the pulmonary arterial tree. Mild respiratory motion artifact. No focal filling defect identified in the pulmonary arteries to suggest acute pulmonary embolism. Negative visible aorta aside from mild atherosclerosis. Calcified coronary artery atherosclerosis is evident. No cardiomegaly or pericardial effusion. Mediastinum/Nodes: Negative.  No lymphadenopathy. Lungs/Pleura: Major airways are patent. There is peripheral/dependent opacity in the superior segment of the  right lower lobe with mild associated peribronchial thickening. No definite consolidation. No pleural effusion. Minimal bilateral dependent atelectasis otherwise. Minimal scarring or atelectasis in the lingula. Upper Abdomen: Negative visualized liver, gallbladder, spleen, pancreas, adrenal glands and bowel in the upper abdomen. Probable left hydronephrosis (series 4, image 96). Musculoskeletal: Mild to moderate compression fractures of the anterior T1 and superior T2 vertebral bodies are new since 2016 and likely subacute (series 8, image 53). The anterior T1 body fracture is comminuted. No retropulsion, and the pedicles and posterior elements of both levels appear intact. Underlying osteopenia. Healed chronic low or sternal fracture. No other acute osseous abnormality identified. Review of the MIP images confirms the above findings. IMPRESSION: 1.  Negative for acute pulmonary embolus. 2. Subacute T1 and T2 compression fractures. No retropulsion of bone or complicating features. 3. Nonspecific peripheral opacity in the superior segment of the right lower lobe may reflect atelectasis but developing infection is difficult to exclude. No pleural effusion. 4. Suggestion of Left Hydronephrosis. Renal Ultrasound may be the simplest way to follow up this finding, or alternatively CT Abdomen and Pelvis. 5. Calcified coronary artery atherosclerosis. Mild for age thoracic aortic atherosclerosis. Electronically Signed  By: Odessa Fleming M.D.   On: 10/21/2016 14:56   [place] Retroperitoneal (renal,aorta,ivc Nodes)  Result Date: 10/21/2016 CLINICAL DATA:  Hydronephrosis seen on chest CT EXAM: RENAL / URINARY TRACT ULTRASOUND COMPLETE COMPARISON:  10/21/2016 chest CT FINDINGS: Right Kidney: Length: 10.4 cm. Echogenicity within normal limits. No mass or calculus. Mild hydronephrosis without obstructing source identified. Findings could potentially represent chronic caliectasis. Left Kidney: Length: 8.6 cm. Echogenicity within normal  limits. No mass or calculus. Mild hydronephrosis is noted without obstructing etiology. Findings could represent chronic caliectasis given what appear to be ureteral jets demonstrated in the bladder. Bladder: Partially distended with ureteral jets seen. There appears to be slight thickening likely due to underdistention. Internal debris is not excluded. IMPRESSION: 1. Bilateral mild hydronephrosis or caliectasis of the renal collecting systems. 2. Nondistended bladder with slight mural thickening and/or debris along its dependent aspect, some which is likely due to underdistention. Electronically Signed   By: Tollie Eth M.D.   On: 10/21/2016 17:09   US Venous Img Lower Bilateral  Result Date: 10/21/2016 CLINICAL DATA:  70 year old female with acute bilateral lower extremity pain. EXAM: BILATERAL LOWER EXTREMITY VENOUS DOPPLER ULTRASOUND TECHNIQUE: Gray-scale sonography with graded compression, as well as color Doppler and duplex ultrasound were performed to evaluate the lower extremity deep venous systems from the level of the common femoral vein and including the common femoral, femoral, profunda femoral, popliteal and calf veins including the posterior tibial, peroneal and gastrocnemius veins when visible. The superficial great saphenous vein was also interrogated. Spectral Doppler was utilized to evaluate flow at rest and with distal augmentation maneuvers in the common femoral, femoral and popliteal veins. COMPARISON:  None. FINDINGS: RIGHT LOWER EXTREMITY Normal flow, compressibility, and augmentation within the distal common femoral, proximal profunda femoral, proximal greater saphenous, entire femoral, popliteal veins, and imaged calf veins. LEFT LOWER EXTREMITY Normal flow, compressibility, and augmentation within the distal common femoral, proximal profunda femoral, proximal greater saphenous, entire femoral, popliteal veins, and imaged calf veins. IMPRESSION: No evidence of DVT within either lower  extremity. Electronically Signed   By: Harmon Pier M.D.   On: 10/21/2016 17:04   Dg Chest Portable 1 View  Result Date: 10/21/2016 CLINICAL DATA:  Increased confusion since a fall on 10/12/2016. Smoker. EXAM: PORTABLE CHEST 1 VIEW COMPARISON:  10/15/2016. FINDINGS: Normal sized heart. Clear lungs. Diffuse osteopenia. 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 IMPRESSION: No acute abnormality. Electronically Signed   By: Beckie Salts M.D.   On: 10/21/2016 12:36   Ct Renal Stone Study  Result Date: 10/21/2016 CLINICAL DATA:  Hydronephrosis on chest CTA EXAM: CT ABDOMEN AND PELVIS WITHOUT CONTRAST TECHNIQUE: Multidetector CT imaging of the abdomen and pelvis was performed following the standard protocol without IV contrast. COMPARISON:  None. FINDINGS: Lower chest: Mild basilar atelectasis Hepatobiliary: No focal hepatic lesion. No biliary duct dilatation. Gallbladder is normal. Common bile duct is normal. Pancreas: Pancreas is normal. No ductal dilatation. No pancreatic inflammation. Spleen: Normal spleen Adrenals/urinary tract: Adrenal glands normal. There is mild bilateral pelvicaliectasis. The LEFT and RIGHT ureter are nonobstructed with contrast excreted into the bladder on delayed imaging. There is suggestive of debris or potential endoluminal filling defect within the distal RIGHT ureter through the pelvic brim (image 53, series 6). Several additional foci of potential debris within the RIGHT renal pelvis. Stomach/Bowel: Stomach, small bowel, appendix, and cecum are normal. The colon and rectosigmoid colon are normal. Vascular/Lymphatic: Abdominal aorta is normal caliber. There is no retroperitoneal or periportal lymphadenopathy. No pelvic lymphadenopathy.  Reproductive: Uterus normal. Other: No free fluid. Musculoskeletal: Bilateral hip prosthetics does generates significant streak artifact the pelvis. IMPRESSION: 1. No evidence of high-grade renal obstruction. 2. Bilateral pelvicaliectasis. 3. Subtle Filling  defects within the RIGHT ureter which could represent debris, lesion or calculus. No obstruction. Recommend either dedicated hematuria protocol for evaluation of RIGHT ureter versus retrograde pyelogram. Electronically Signed   By: Genevive BiStewart  Edmunds M.D.   On: 10/21/2016 19:36    Positive ROS: All other systems have been reviewed and were otherwise negative with the exception of those mentioned in the HPI and as above.  Physical Exam: General: Well developed, well nourished female seen in no acute distress. HEENT: Atraumatic and normocephalic. Sclera are clear. Extraocular motion is intact. Oropharynx is clear with moist mucosa. Neck: Supple, nontender, good range of motion. No JVD or carotid bruits. Lungs: Clear to auscultation bilaterally. Cardiovascular: Regular rate and rhythm with normal S1 and S2. No murmurs. No gallops or rubs. Pedal pulses are palpable bilaterally. Homans test is negative bilaterally. No significant pretibial or ankle edema. Abdomen: Soft, nontender, and nondistended. Bowel sounds are present. Skin: No lesions in the area of chief complaint Neurologic: patient is arousable and follows commands however she does not open eyes. She ounces few questions and then falls back to sleep. Sensory and motor testing is not possible as patient does not follow any commands. She does however move bilateral feet with ankle dorsiflexion and plantarflexion.Marland Kitchen. Lymphatic: No axillary or cervical lymphadenopathy  MUSCULOSKELETAL: patient has no significant tenderness to palpation over her cervicothoracic junction. Passive neck range of motion does not elicit any significant pain. Movements of bilateral knee and upper extremity joints do not elicit any significant pain.  Assessment: 71 years old female with compression fracture involving T1 and T2 of unknown chronicity.  Plan: 71 years old female with compression fracture involving T1 and T2 off of unknown chronicity. To see whether the  fracture is acute or chronic patient will be scheduled for an MRI of the thoracic spine. Also the MRI will determine whether the fractures are pathological in nature due to any metastatic spine disease. If fractures are nonpathological, patient can be managed conservativelyith follow-up in the clinic. These fractures may have resulted from underlying osteoporosis after her recent fall on August 24th. Patient is scheduled for an thoracic spine MRI on 10/22/2016.  Tina Ellison. M.D.

## 2016-10-21 NOTE — ED Notes (Signed)
Report given to [name] at nursing facility at this time, updated on pt being admitted.

## 2016-10-21 NOTE — ED Notes (Signed)
Patient transported to Ultrasound 

## 2016-10-22 ENCOUNTER — Inpatient Hospital Stay: Payer: Medicare Other

## 2016-10-22 DIAGNOSIS — N1330 Unspecified hydronephrosis: Secondary | ICD-10-CM

## 2016-10-22 LAB — COMPREHENSIVE METABOLIC PANEL
ALBUMIN: 1.9 g/dL — AB (ref 3.5–5.0)
ALK PHOS: 73 U/L (ref 38–126)
ALT: 10 U/L — AB (ref 14–54)
AST: 13 U/L — AB (ref 15–41)
Anion gap: 7 (ref 5–15)
BILIRUBIN TOTAL: 0.6 mg/dL (ref 0.3–1.2)
BUN: 16 mg/dL (ref 6–20)
CALCIUM: 7.8 mg/dL — AB (ref 8.9–10.3)
CO2: 28 mmol/L (ref 22–32)
CREATININE: 0.58 mg/dL (ref 0.44–1.00)
Chloride: 103 mmol/L (ref 101–111)
GFR calc Af Amer: >60 mL/min (ref >60)
GFR calc non Af Amer: >60 mL/min (ref >60)
GLUCOSE: 98 mg/dL (ref 65–99)
POTASSIUM: 3.6 mmol/L (ref 3.5–5.1)
Sodium: 138 mmol/L (ref 135–145)
Total Protein: 5.2 g/dL — ABNORMAL LOW (ref 6.5–8.1)

## 2016-10-22 LAB — PROTIME-INR
INR: 1.36
PROTHROMBIN TIME: 16.7 s — AB (ref 11.4–15.2)

## 2016-10-22 LAB — CBC
HEMATOCRIT: 33.9 % — AB (ref 35.0–47.0)
HEMOGLOBIN: 11.7 g/dL — AB (ref 12.0–16.0)
MCH: 29.2 pg (ref 26.0–34.0)
MCHC: 34.4 g/dL (ref 32.0–36.0)
MCV: 85 fL (ref 80.0–100.0)
Platelets: 269 10*3/uL (ref 150–440)
RBC: 3.99 MIL/uL (ref 3.80–5.20)
RDW: 14.3 % (ref 11.5–14.5)
WBC: 11.1 10*3/uL — AB (ref 3.6–11.0)

## 2016-10-22 NOTE — Progress Notes (Signed)
Subjective: Patient reports She feels much better today.No specific compnts.  Objective: Vital signs in last 24 hours: Temp:  [97.5 F (36.4 C)-98.8 F (37.1 C)] 97.5 F (36.4 C) (09/03 0410) Pulse Rate:  [92-112] 92 (09/03 0410) Resp:  [18-20] 20 (09/03 0410) BP: (99-120)/(50-63) 103/50 (09/03 0410) SpO2:  [89 %-100 %] 93 % (09/03 0602) Weight:  [60.1 kg (132 lb 8 oz)] 60.1 kg (132 lb 8 oz) (09/02 1726)  Intake/Output from previous day: 09/02 0701 - 09/03 0700 In: 2261.2 [I.V.:1626.2; IV Piggyback:200] Out: 35 [Urine:35] Intake/Output this shift: Total I/O In: 50 [IV Piggyback:50] Out: -  Urine in bag clear   Physical Exam:  She is in no acute distress She is alert and smiling but I'm not sure how oriented-she wouldn't really answer my questions. Not confused, just asked me why I was asking her.  Abdomen soft and nontender No CVA tenderness Urine clear  Lab Results:  Recent Labs  10/21/16 1131 10/22/16 0336  HGB 14.6 11.7*  HCT 43.3 33.9*   BMET  Recent Labs  10/21/16 1131 10/22/16 0336  NA 138 138  K 4.3 3.6  CL 99* 103  CO2 30 28  GLUCOSE 151* 98  BUN 16 16  CREATININE 0.97 0.58  CALCIUM 8.6* 7.8*    Recent Labs  10/22/16 0336  INR 1.36   No results for input(s): LABURIN in the last 72 hours. Results for orders placed or performed during the hospital encounter of 06/21/16  Blood culture (routine x 2)     Status: None   Collection Time: 06/21/16  9:58 AM  Result Value Ref Range Status   Specimen Description BLOOD BLOOD RIGHT FOREARM  Final   Special Requests   Final    BOTTLES DRAWN AEROBIC AND ANAEROBIC Blood Culture adequate volume   Culture NO GROWTH 5 DAYS  Final   Report Status 06/26/2016 FINAL  Final  Blood culture (routine x 2)     Status: None   Collection Time: 06/21/16 12:15 PM  Result Value Ref Range Status   Specimen Description BLOOD LEFT ANTECUBITAL  Final   Special Requests   Final    BOTTLES DRAWN AEROBIC AND ANAEROBIC  Blood Culture adequate volume   Culture NO GROWTH 5 DAYS  Final   Report Status 06/26/2016 FINAL  Final  Urine culture     Status: Abnormal   Collection Time: 06/21/16  4:04 PM  Result Value Ref Range Status   Specimen Description URINE, CLEAN CATCH  Final   Special Requests NONE  Final   Culture (A)  Final    <10,000 COLONIES/mL INSIGNIFICANT GROWTH Performed at [facility], [address] [place], [place] [address]    Report Status 06/23/2016 FINAL  Final  MRSA PCR Screening     Status: None   Collection Time: 06/21/16  9:52 PM  Result Value Ref Range Status   MRSA by PCR NEGATIVE NEGATIVE Final    Comment:        The GeneXpert MRSA Assay (FDA approved for NASAL specimens only), is one component of a comprehensive MRSA colonization surveillance program. It is not intended to diagnose MRSA infection nor to guide or monitor treatment for MRSA infections.     Studies/Results: Ct Head Wo Contrast  Result Date: 10/21/2016 CLINICAL DATA:  Increased confusion since a fall on 10/12/2016. EXAM: CT HEAD WITHOUT CONTRAST TECHNIQUE: Contiguous axial images were obtained from the base of the skull through the vertex without intravenous contrast. COMPARISON:  10/12/2016.  FINDINGS: Brain: Diffusely enlarged ventricles and subarachnoid spaces. Patchy white matter low density in both cerebral hemispheres. No intracranial hemorrhage, mass lesion or CT evidence of acute infarction. Vascular: No hyperdense vessel or unexpected calcification. Skull: Normal. Negative for fracture or focal lesion. Sinuses/Orbits: Unremarkable. Other: None. IMPRESSION: 1. No acute abnormality. 2. Stable atrophy and chronic small vessel white matter ischemic changes. Electronically Signed   By: Beckie Salts M.D.   On: 10/21/2016 12:39   Ct Angio Chest Pe W And/or Wo Contrast  Result Date: 10/21/2016 CLINICAL DATA:  71 year old female with increased confusion after a fall on 10/12/2016. Hypoxia and abnormal  D-dimer. EXAM: CT ANGIOGRAPHY CHEST WITH CONTRAST TECHNIQUE: Multidetector CT imaging of the chest was performed using the standard protocol during bolus administration of intravenous contrast. Multiplanar CT image reconstructions and MIPs were obtained to evaluate the vascular anatomy. CONTRAST:  75 mL Isovue 370 COMPARISON:  Chest radiographs 1221 hours today and earlier. CT cervical spine 10/12/2016, 04/06/2014 FINDINGS: Cardiovascular: Good contrast bolus timing in the pulmonary arterial tree. Mild respiratory motion artifact. No focal filling defect identified in the pulmonary arteries to suggest acute pulmonary embolism. Negative visible aorta aside from mild atherosclerosis. Calcified coronary artery atherosclerosis is evident. No cardiomegaly or pericardial effusion. Mediastinum/Nodes: Negative.  No lymphadenopathy. Lungs/Pleura: Major airways are patent. There is peripheral/dependent opacity in the superior segment of the right lower lobe with mild associated peribronchial thickening. No definite consolidation. No pleural effusion. Minimal bilateral dependent atelectasis otherwise. Minimal scarring or atelectasis in the lingula. Upper Abdomen: Negative visualized liver, gallbladder, spleen, pancreas, adrenal glands and bowel in the upper abdomen. Probable left hydronephrosis (series 4, image 96). Musculoskeletal: Mild to moderate compression fractures of the anterior T1 and superior T2 vertebral bodies are new since 2016 and likely subacute (series 8, image 53). The anterior T1 body fracture is comminuted. No retropulsion, and the pedicles and posterior elements of both levels appear intact. Underlying osteopenia. Healed chronic low or sternal fracture. No other acute osseous abnormality identified. Review of the MIP images confirms the above findings. IMPRESSION: 1.  Negative for acute pulmonary embolus. 2. Subacute T1 and T2 compression fractures. No retropulsion of bone or complicating features. 3.  Nonspecific peripheral opacity in the superior segment of the right lower lobe may reflect atelectasis but developing infection is difficult to exclude. No pleural effusion. 4. Suggestion of Left Hydronephrosis. Renal Ultrasound may be the simplest way to follow up this finding, or alternatively CT Abdomen and Pelvis. 5. Calcified coronary artery atherosclerosis. Mild for age thoracic aortic atherosclerosis. Electronically Signed   By: Odessa Fleming M.D.   On: 10/21/2016 14:56   Mr Thoracic Spine Wo Contrast  Result Date: 10/22/2016 CLINICAL DATA:  Traumatic T1 and T2 fractures.  Fall 10/12/2016. EXAM: MRI THORACIC SPINE WITHOUT CONTRAST TECHNIQUE: Multiplanar, multisequence MR imaging of the thoracic spine was performed. No intravenous contrast was administered. COMPARISON:  CTA of the chest 10/21/2016 FINDINGS: Alignment:  AP alignment is anatomic. Vertebrae: Acute/subacute superior endplate fractures aunt T1 and T2 are confirmed. There is 40% loss of height at T1 and 20% loss of height at T2. Mild endplate edema is noted anteriorly and superiorly at C6. This may reflect a slight fracture. There is no retropulsed bone. No focal stenosis is evident. Marrow signal and vertebral body heights are normal throughout the remainder of the thoracic spine. Cord: Normal signal is present throughout the thoracic spine to the conus medullaris which terminates at L1-2. Paraspinal and other soft tissues: Paraspinous soft tissues are  within normal limits. Limited imaging of the chest is unremarkable. Visualized upper abdomen is within normal limits. Disc levels: A central disc protrusion at T7-8 partially effaces the ventral CSF without significant stenosis. No other significant disc disease is present. Foramina are patent bilaterally. IMPRESSION: 1. Acute/subacute superior endplate compression fractures at T1 and T2 without significant retropulsion of bone. 2. Question minimally displaced anterior superior endplate fracture at C6.  3. No other focal marrow signal abnormality or edema within the thoracic spine. 4. Shallow central disc protrusion at T7-8 without significant stenosis. Electronically Signed   By: Marin Roberts M.D.   On: 10/22/2016 09:51   [place] Retroperitoneal (renal,aorta,ivc Nodes)  Result Date: 10/21/2016 CLINICAL DATA:  Hydronephrosis seen on chest CT EXAM: RENAL / URINARY TRACT ULTRASOUND COMPLETE COMPARISON:  10/21/2016 chest CT FINDINGS: Right Kidney: Length: 10.4 cm. Echogenicity within normal limits. No mass or calculus. Mild hydronephrosis without obstructing source identified. Findings could potentially represent chronic caliectasis. Left Kidney: Length: 8.6 cm. Echogenicity within normal limits. No mass or calculus. Mild hydronephrosis is noted without obstructing etiology. Findings could represent chronic caliectasis given what appear to be ureteral jets demonstrated in the bladder. Bladder: Partially distended with ureteral jets seen. There appears to be slight thickening likely due to underdistention. Internal debris is not excluded. IMPRESSION: 1. Bilateral mild hydronephrosis or caliectasis of the renal collecting systems. 2. Nondistended bladder with slight mural thickening and/or debris along its dependent aspect, some which is likely due to underdistention. Electronically Signed   By: Tollie Eth M.D.   On: 10/21/2016 17:09   US Venous Img Lower Bilateral  Result Date: 10/21/2016 CLINICAL DATA:  71 year old female with acute bilateral lower extremity pain. EXAM: BILATERAL LOWER EXTREMITY VENOUS DOPPLER ULTRASOUND TECHNIQUE: Gray-scale sonography with graded compression, as well as color Doppler and duplex ultrasound were performed to evaluate the lower extremity deep venous systems from the level of the common femoral vein and including the common femoral, femoral, profunda femoral, popliteal and calf veins including the posterior tibial, peroneal and gastrocnemius veins when visible. The superficial  great saphenous vein was also interrogated. Spectral Doppler was utilized to evaluate flow at rest and with distal augmentation maneuvers in the common femoral, femoral and popliteal veins. COMPARISON:  None. FINDINGS: RIGHT LOWER EXTREMITY Normal flow, compressibility, and augmentation within the distal common femoral, proximal profunda femoral, proximal greater saphenous, entire femoral, popliteal veins, and imaged calf veins. LEFT LOWER EXTREMITY Normal flow, compressibility, and augmentation within the distal common femoral, proximal profunda femoral, proximal greater saphenous, entire femoral, popliteal veins, and imaged calf veins. IMPRESSION: No evidence of DVT within either lower extremity. Electronically Signed   By: Harmon Pier M.D.   On: 10/21/2016 17:04   Dg Chest Portable 1 View  Result Date: 10/21/2016 CLINICAL DATA:  Increased confusion since a fall on 10/12/2016. Smoker. EXAM: PORTABLE CHEST 1 VIEW COMPARISON:  10/15/2016. FINDINGS: Normal sized heart. Clear lungs. Diffuse osteopenia. 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 IMPRESSION: No acute abnormality. Electronically Signed   By: Beckie Salts M.D.   On: 10/21/2016 12:36   Ct Renal Stone Study  Result Date: 10/21/2016 CLINICAL DATA:  Hydronephrosis on chest CTA EXAM: CT ABDOMEN AND PELVIS WITHOUT CONTRAST TECHNIQUE: Multidetector CT imaging of the abdomen and pelvis was performed following the standard protocol without IV contrast. COMPARISON:  None. FINDINGS: Lower chest: Mild basilar atelectasis Hepatobiliary: No focal hepatic lesion. No biliary duct dilatation. Gallbladder is normal. Common bile duct is normal. Pancreas: Pancreas is normal. No ductal dilatation. No  pancreatic inflammation. Spleen: Normal spleen Adrenals/urinary tract: Adrenal glands normal. There is mild bilateral pelvicaliectasis. The LEFT and RIGHT ureter are nonobstructed with contrast excreted into the bladder on delayed imaging. There is suggestive of debris or  potential endoluminal filling defect within the distal RIGHT ureter through the pelvic brim (image 53, series 6). Several additional foci of potential debris within the RIGHT renal pelvis. Stomach/Bowel: Stomach, small bowel, appendix, and cecum are normal. The colon and rectosigmoid colon are normal. Vascular/Lymphatic: Abdominal aorta is normal caliber. There is no retroperitoneal or periportal lymphadenopathy. No pelvic lymphadenopathy. Reproductive: Uterus normal. Other: No free fluid. Musculoskeletal: Bilateral hip prosthetics does generates significant streak artifact the pelvis. IMPRESSION: 1. No evidence of high-grade renal obstruction. 2. Bilateral pelvicaliectasis. 3. Subtle Filling defects within the RIGHT ureter which could represent debris, lesion or calculus. No obstruction. Recommend either dedicated hematuria protocol for evaluation of RIGHT ureter versus retrograde pyelogram. Electronically Signed   By: Genevive Bi M.D.   On: 10/21/2016 19:36  I reviewed the ultrasound and CT images  Assessment/Plan: --bilateral hydro right > left with ureteral dilation to a very thickened bladder, renal US with same and bilateral jets - s/p foley. Good UOP. Cr normalized.  --UTI - cx's pending  Once her cultures are back and she is transitioned to by mouth antibiotics, I believe it would be reasonable to do a voiding trial/foley removal. I would obtain a renal ultrasound about 2 weeks after follow-up to recheck her kidneys and bladder.    LOS: 1 day   Lamoyne Hessel 10/22/2016, 11:56 AM

## 2016-10-22 NOTE — Plan of Care (Signed)
Problem: Safety: Goal: Ability to remain free from injury will improve Outcome: Progressing Pt is high fall risk with yellow armband on and yellow socks on. Bed alarm is on.

## 2016-10-22 NOTE — Progress Notes (Signed)
[employer] [facility] [place] [facility]   PATIENT NAME: Tina Ellison    MR#:  [id number]  DATE OF BIRTH:  Mar 21, 1945  SUBJECTIVE:  CHIEF COMPLAINT:   Chief Complaint  Patient presents with  . Altered Mental Status  Much more awake and alert now.  Feeling weak, some back pain, and wanting to eat REVIEW OF SYSTEMS:  Review of Systems  Constitutional: Positive for malaise/fatigue. Negative for chills, fever and weight loss.  HENT: Negative for nosebleeds and sore throat.   Eyes: Negative for blurred vision.  Respiratory: Negative for cough, shortness of breath and wheezing.   Cardiovascular: Negative for chest pain, orthopnea, leg swelling and PND.  Gastrointestinal: Negative for abdominal pain, constipation, diarrhea, heartburn, nausea and vomiting.  Genitourinary: Negative for dysuria and urgency.  Musculoskeletal: Positive for back pain.  Skin: Negative for rash.  Neurological: Positive for weakness. Negative for dizziness, speech change, focal weakness and headaches.  Endo/Heme/Allergies: Does not bruise/bleed easily.  Psychiatric/Behavioral: Negative for depression.   DRUG ALLERGIES:   Allergies  Allergen Reactions  . Haldol [Haloperidol]     Patient reported-states dry mouth. No allergies are listed on MAR   VITALS:  Blood pressure (!) 103/50, pulse 92, temperature (!) 97.5 F (36.4 C), temperature source Oral, resp. rate 20, weight 60.1 kg (132 lb 8 oz), SpO2 93 %. PHYSICAL EXAMINATION:  Physical Exam  Constitutional: She is oriented to person, place, and time and well-developed, well-nourished, and in no distress.  HENT:  Head: Normocephalic and atraumatic.  Eyes: Pupils are equal, round, and reactive to light. Conjunctivae and EOM are normal.  Neck: Normal range of motion. Neck supple. No tracheal deviation present. No thyromegaly present.  Cardiovascular: Normal rate, regular rhythm and normal heart sounds.   Pulmonary/Chest: Effort normal and  breath sounds normal. No respiratory distress. She has no wheezes. She exhibits no tenderness.  Abdominal: Soft. Bowel sounds are normal. She exhibits no distension. There is no tenderness.  Musculoskeletal: Normal range of motion.  Neurological: She is alert and oriented to person, place, and time. No cranial nerve deficit.  Skin: Skin is warm and dry. No rash noted.  Psychiatric: Mood and affect normal.   LABORATORY PANEL:  Female CBC  Recent Labs Lab 10/22/16 0336  WBC 11.1*  HGB 11.7*  HCT 33.9*  PLT 269   ------------------------------------------------------------------------------------------------------------------ Chemistries   Recent Labs Lab 10/22/16 0336  NA 138  K 3.6  CL 103  CO2 28  GLUCOSE 98  BUN 16  CREATININE 0.58  CALCIUM 7.8*  AST 13*  ALT 10*  ALKPHOS 73  BILITOT 0.6   RADIOLOGY:  Ct Head Wo Contrast  Result Date: 10/21/2016 CLINICAL DATA:  Increased confusion since a fall on 10/12/2016. EXAM: CT HEAD WITHOUT CONTRAST TECHNIQUE: Contiguous axial images were obtained from the base of the skull through the vertex without intravenous contrast. COMPARISON:  10/12/2016. FINDINGS: Brain: Diffusely enlarged ventricles and subarachnoid spaces. Patchy white matter low density in both cerebral hemispheres. No intracranial hemorrhage, mass lesion or CT evidence of acute infarction. Vascular: No hyperdense vessel or unexpected calcification. Skull: Normal. Negative for fracture or focal lesion. Sinuses/Orbits: Unremarkable. Other: None. IMPRESSION: 1. No acute abnormality. 2. Stable atrophy and chronic small vessel white matter ischemic changes. Electronically Signed   By: Beckie Salts M.D.   On: 10/21/2016 12:39   Ct Angio Chest Pe W And/or Wo Contrast  Result Date: 10/21/2016 CLINICAL DATA:  71 year old female with increased confusion after a fall on 10/12/2016.  Hypoxia and abnormal D-dimer. EXAM: CT ANGIOGRAPHY CHEST WITH CONTRAST TECHNIQUE: Multidetector CT  imaging of the chest was performed using the standard protocol during bolus administration of intravenous contrast. Multiplanar CT image reconstructions and MIPs were obtained to evaluate the vascular anatomy. CONTRAST:  75 mL Isovue 370 COMPARISON:  Chest radiographs 1221 hours today and earlier. CT cervical spine 10/12/2016, 04/06/2014 FINDINGS: Cardiovascular: Good contrast bolus timing in the pulmonary arterial tree. Mild respiratory motion artifact. No focal filling defect identified in the pulmonary arteries to suggest acute pulmonary embolism. Negative visible aorta aside from mild atherosclerosis. Calcified coronary artery atherosclerosis is evident. No cardiomegaly or pericardial effusion. Mediastinum/Nodes: Negative.  No lymphadenopathy. Lungs/Pleura: Major airways are patent. There is peripheral/dependent opacity in the superior segment of the right lower lobe with mild associated peribronchial thickening. No definite consolidation. No pleural effusion. Minimal bilateral dependent atelectasis otherwise. Minimal scarring or atelectasis in the lingula. Upper Abdomen: Negative visualized liver, gallbladder, spleen, pancreas, adrenal glands and bowel in the upper abdomen. Probable left hydronephrosis (series 4, image 96). Musculoskeletal: Mild to moderate compression fractures of the anterior T1 and superior T2 vertebral bodies are new since 2016 and likely subacute (series 8, image 53). The anterior T1 body fracture is comminuted. No retropulsion, and the pedicles and posterior elements of both levels appear intact. Underlying osteopenia. Healed chronic low or sternal fracture. No other acute osseous abnormality identified. Review of the MIP images confirms the above findings. IMPRESSION: 1.  Negative for acute pulmonary embolus. 2. Subacute T1 and T2 compression fractures. No retropulsion of bone or complicating features. 3. Nonspecific peripheral opacity in the superior segment of the right lower lobe may  reflect atelectasis but developing infection is difficult to exclude. No pleural effusion. 4. Suggestion of Left Hydronephrosis. Renal Ultrasound may be the simplest way to follow up this finding, or alternatively CT Abdomen and Pelvis. 5. Calcified coronary artery atherosclerosis. Mild for age thoracic aortic atherosclerosis. Electronically Signed   By: Odessa FlemingH  Hall M.D.   On: 10/21/2016 14:56   Mr Thoracic Spine Wo Contrast  Result Date: 10/22/2016 CLINICAL DATA:  Traumatic T1 and T2 fractures.  Fall 10/12/2016. EXAM: MRI THORACIC SPINE WITHOUT CONTRAST TECHNIQUE: Multiplanar, multisequence MR imaging of the thoracic spine was performed. No intravenous contrast was administered. COMPARISON:  CTA of the chest 10/21/2016 FINDINGS: Alignment:  AP alignment is anatomic. Vertebrae: Acute/subacute superior endplate fractures aunt T1 and T2 are confirmed. There is 40% loss of height at T1 and 20% loss of height at T2. Mild endplate edema is noted anteriorly and superiorly at C6. This may reflect a slight fracture. There is no retropulsed bone. No focal stenosis is evident. Marrow signal and vertebral body heights are normal throughout the remainder of the thoracic spine. Cord: Normal signal is present throughout the thoracic spine to the conus medullaris which terminates at L1-2. Paraspinal and other soft tissues: Paraspinous soft tissues are within normal limits. Limited imaging of the chest is unremarkable. Visualized upper abdomen is within normal limits. Disc levels: A central disc protrusion at T7-8 partially effaces the ventral CSF without significant stenosis. No other significant disc disease is present. Foramina are patent bilaterally. IMPRESSION: 1. Acute/subacute superior endplate compression fractures at T1 and T2 without significant retropulsion of bone. 2. Question minimally displaced anterior superior endplate fracture at C6. 3. No other focal marrow signal abnormality or edema within the thoracic spine. 4.  Shallow central disc protrusion at T7-8 without significant stenosis. Electronically Signed   By: Virl Sonhristopher  Mattern M.D.  On: 10/22/2016 09:51   [place] Retroperitoneal (renal,aorta,ivc Nodes)  Result Date: 10/21/2016 CLINICAL DATA:  Hydronephrosis seen on chest CT EXAM: RENAL / URINARY TRACT ULTRASOUND COMPLETE COMPARISON:  10/21/2016 chest CT FINDINGS: Right Kidney: Length: 10.4 cm. Echogenicity within normal limits. No mass or calculus. Mild hydronephrosis without obstructing source identified. Findings could potentially represent chronic caliectasis. Left Kidney: Length: 8.6 cm. Echogenicity within normal limits. No mass or calculus. Mild hydronephrosis is noted without obstructing etiology. Findings could represent chronic caliectasis given what appear to be ureteral jets demonstrated in the bladder. Bladder: Partially distended with ureteral jets seen. There appears to be slight thickening likely due to underdistention. Internal debris is not excluded. IMPRESSION: 1. Bilateral mild hydronephrosis or caliectasis of the renal collecting systems. 2. Nondistended bladder with slight mural thickening and/or debris along its dependent aspect, some which is likely due to underdistention. Electronically Signed   By: Tollie Eth M.D.   On: 10/21/2016 17:09   US Venous Img Lower Bilateral  Result Date: 10/21/2016 CLINICAL DATA:  71 year old female with acute bilateral lower extremity pain. EXAM: BILATERAL LOWER EXTREMITY VENOUS DOPPLER ULTRASOUND TECHNIQUE: Gray-scale sonography with graded compression, as well as color Doppler and duplex ultrasound were performed to evaluate the lower extremity deep venous systems from the level of the common femoral vein and including the common femoral, femoral, profunda femoral, popliteal and calf veins including the posterior tibial, peroneal and gastrocnemius veins when visible. The superficial great saphenous vein was also interrogated. Spectral Doppler was utilized to  evaluate flow at rest and with distal augmentation maneuvers in the common femoral, femoral and popliteal veins. COMPARISON:  None. FINDINGS: RIGHT LOWER EXTREMITY Normal flow, compressibility, and augmentation within the distal common femoral, proximal profunda femoral, proximal greater saphenous, entire femoral, popliteal veins, and imaged calf veins. LEFT LOWER EXTREMITY Normal flow, compressibility, and augmentation within the distal common femoral, proximal profunda femoral, proximal greater saphenous, entire femoral, popliteal veins, and imaged calf veins. IMPRESSION: No evidence of DVT within either lower extremity. Electronically Signed   By: Harmon Pier M.D.   On: 10/21/2016 17:04   Dg Chest Portable 1 View  Result Date: 10/21/2016 CLINICAL DATA:  Increased confusion since a fall on 10/12/2016. Smoker. EXAM: PORTABLE CHEST 1 VIEW COMPARISON:  10/15/2016. FINDINGS: Normal sized heart. Clear lungs. Diffuse osteopenia. 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 IMPRESSION: No acute abnormality. Electronically Signed   By: Beckie Salts M.D.   On: 10/21/2016 12:36   Ct Renal Stone Study  Result Date: 10/21/2016 CLINICAL DATA:  Hydronephrosis on chest CTA EXAM: CT ABDOMEN AND PELVIS WITHOUT CONTRAST TECHNIQUE: Multidetector CT imaging of the abdomen and pelvis was performed following the standard protocol without IV contrast. COMPARISON:  None. FINDINGS: Lower chest: Mild basilar atelectasis Hepatobiliary: No focal hepatic lesion. No biliary duct dilatation. Gallbladder is normal. Common bile duct is normal. Pancreas: Pancreas is normal. No ductal dilatation. No pancreatic inflammation. Spleen: Normal spleen Adrenals/urinary tract: Adrenal glands normal. There is mild bilateral pelvicaliectasis. The LEFT and RIGHT ureter are nonobstructed with contrast excreted into the bladder on delayed imaging. There is suggestive of debris or potential endoluminal filling defect within the distal RIGHT ureter through the  pelvic brim (image 53, series 6). Several additional foci of potential debris within the RIGHT renal pelvis. Stomach/Bowel: Stomach, small bowel, appendix, and cecum are normal. The colon and rectosigmoid colon are normal. Vascular/Lymphatic: Abdominal aorta is normal caliber. There is no retroperitoneal or periportal lymphadenopathy. No pelvic lymphadenopathy. Reproductive: Uterus normal. Other: No free fluid.  Musculoskeletal: Bilateral hip prosthetics does generates significant streak artifact the pelvis. IMPRESSION: 1. No evidence of high-grade renal obstruction. 2. Bilateral pelvicaliectasis. 3. Subtle Filling defects within the RIGHT ureter which could represent debris, lesion or calculus. No obstruction. Recommend either dedicated hematuria protocol for evaluation of RIGHT ureter versus retrograde pyelogram. Electronically Signed   By: Genevive Bi M.D.   On: 10/21/2016 19:36   ASSESSMENT AND PLAN:  Tamkia Temples  is a 71 y.o. female with a known history of COPD, GERD, hypothyroidism, schizoaffective disorder, schizophrenia and multiple other medical problems is brought into the ED from assisted living facility for altered mental status According to the EMS report patient fell on 24th of August and was seen at [facility], at that time CT head was negative and was discharged on August 27 but her mental status is deteriorating and falling asleep a lot.  # Altered mental status secondary to sepsis -Present on admission - CT head is negative -Seems back to her baseline mental status now  #Sepsis secondary to healthcare associated pneumonia and UTI Pancultures ordered including urine culture and sensitivity Broad-spectrum IV antibiotics Zosyn and vancomycin, De-escalate antibiotics based on the culture results -Continue IV fluids, supportive treatment  #elevated d-dimer from the underlying sepsis CT angiogram is negative for pulmonary embolism Patient has calf tenderness-Bilateral  lower Extremity venous Dopplers neg for DVT  #Bilateral mild hydronephrosis: Likely due to urinary obstruction -Appreciate urology input Foley catheter placed to monitor urine output  #Subacute T1 and T2 compression fracture -Appreciate orthopedics input -Commended MRI of the thoracic spine - shows Acute/subacute superior endplate compression fractures at T1 and T2 without significant retropulsion of bone. -Likely conservative management, pain control  #chronic history of hypothyroidism continue Synthroid ; TSH is normal  DVT prophylaxis with Lovenox subcutaneous   Physical therapy evaluation  All the records are reviewed and case discussed with Care Management/Social Worker. Management plans discussed with the patient, nursing and they are in agreement.  CODE STATUS: Full Code  TOTAL TIME TAKING CARE OF THIS PATIENT: 35 minutes.   More than 50% of the time was spent in counseling/coordination of care: YES  POSSIBLE D/C IN 1-2 DAYS, DEPENDING ON CLINICAL CONDITION.   Delfino Lovett M.D on 10/22/2016 at 12:01 PM  Between 7am to 6pm - Pager - [phone]  After 6pm go to [email or web address] - Social research officer, government  [employer] [facility]  Office  [phone]  CC: Primary care physician; Duffy Rhody, FNP  Note: This dictation was prepared with Dragon dictation along with smaller phrase technology. Any transcriptional errors that result from this process are unintentional.

## 2016-10-22 NOTE — Progress Notes (Addendum)
OT Cancellation Note  Patient Details Name: Tina Ellison MRN: [id number] DOB: [birth year]   Cancelled Treatment:    Reason Eval/Treat Not Completed: Fatigue/lethargy limiting ability to participate;Patient's level of consciousness. Order received, chart reviewed. Upon attempt, pt sleeping soundly, did not wake to verbal or tactile stimuli. Limited reaction to light sternal rubs, frowned but never opened her eyes. RN notified. Too lethargic to participate in OT evaluation at this time. Will re-attempt next date as pt is medically appropriate and available.  Richrd PrimeJamie Stiller, MPH, MS, OTR/L ascom [phone] 10/22/16, 2:10 PM

## 2016-10-23 ENCOUNTER — Inpatient Hospital Stay: Payer: Medicare Other

## 2016-10-23 DIAGNOSIS — N1330 Unspecified hydronephrosis: Secondary | ICD-10-CM

## 2016-10-23 DIAGNOSIS — R319 Hematuria, unspecified: Secondary | ICD-10-CM

## 2016-10-23 DIAGNOSIS — N390 Urinary tract infection, site not specified: Secondary | ICD-10-CM

## 2016-10-23 DIAGNOSIS — R4182 Altered mental status, unspecified: Secondary | ICD-10-CM

## 2016-10-23 LAB — CBC
HCT: 35.1 % (ref 35.0–47.0)
HEMOGLOBIN: 11.9 g/dL — AB (ref 12.0–16.0)
MCH: 28.8 pg (ref 26.0–34.0)
MCHC: 34 g/dL (ref 32.0–36.0)
MCV: 84.6 fL (ref 80.0–100.0)
Platelets: 269 10*3/uL (ref 150–440)
RBC: 4.15 MIL/uL (ref 3.80–5.20)
RDW: 14.5 % (ref 11.5–14.5)
WBC: 10.8 10*3/uL (ref 3.6–11.0)

## 2016-10-23 LAB — BASIC METABOLIC PANEL
Anion gap: 6 (ref 5–15)
BUN: 14 mg/dL (ref 6–20)
CALCIUM: 8 mg/dL — AB (ref 8.9–10.3)
CHLORIDE: 104 mmol/L (ref 101–111)
CO2: 26 mmol/L (ref 22–32)
CREATININE: 0.61 mg/dL (ref 0.44–1.00)
GFR calc non Af Amer: >60 mL/min (ref >60)
Glucose, Bld: 114 mg/dL — ABNORMAL HIGH (ref 65–99)
Potassium: 4 mmol/L (ref 3.5–5.1)
SODIUM: 136 mmol/L (ref 135–145)

## 2016-10-23 LAB — PROCALCITONIN: Procalcitonin: <0.1 ng/mL

## 2016-10-23 LAB — VANCOMYCIN, TROUGH: Vancomycin Tr: 12 ug/mL — ABNORMAL LOW (ref 15–20)

## 2016-10-23 MED ORDER — DEXTROSE 5 % IV SOLN
1.0000 g | INTRAVENOUS | Status: DC
  Administered 2016-10-23: 18:00:00 1 g via INTRAVENOUS
  Filled 2016-10-23 (×2): qty 10

## 2016-10-23 MED ORDER — DIVALPROEX SODIUM ER 500 MG PO TB24
500.0000 mg | ORAL_TABLET | Freq: Two times a day (BID) | ORAL | Status: DC
  Administered 2016-10-23 – 2016-10-26 (×6): 500 mg via ORAL
  Filled 2016-10-23 (×7): qty 1

## 2016-10-23 NOTE — Progress Notes (Signed)
PT Cancellation Note  Patient Details Name: Tina Ellison MRN: [id number] DOB: Jun 05, 1945   Cancelled Treatment:    Reason Eval/Treat Not Completed: Other (comment).  PT consult received.  Chart reviewed.  Imaging showing subacute T1 and T2 compression fx's and question minimally displaced anterior superior endplate fx at C6.  Pt pending follow-up by ortho after results of MRI.  Per discussion with MD Sherryll BurgerShah, will hold PT until ortho follow-up and proceed as medically appropriate.  Hendricks LimesEmily Zephyra Bernardi, PT 10/23/16, 9:08 AM [phone]

## 2016-10-23 NOTE — Progress Notes (Signed)
Urology Consult Follow Up  Subjective: Patient still very confused.  She reported being out of bed pan, but she eliminated in the bed sheets.  Urine is a clear yellow.  UOP good.  Urine culture is still pending.  Creatinine is up to 0.61 from 0.58.   Anti-infectives: Anti-infectives    Start     Dose/Rate Route Frequency Ordered Stop   10/22/16 0100  vancomycin (VANCOCIN) IVPB 750 mg/150 ml premix     750 mg 150 mL/hr over 60 Minutes Intravenous Every 12 hours 10/21/16 1802     10/21/16 1900  vancomycin (VANCOCIN) IVPB 750 mg/150 ml premix     750 mg 150 mL/hr over 60 Minutes Intravenous  Once 10/21/16 1801 10/21/16 2137   10/21/16 1900  piperacillin-tazobactam (ZOSYN) IVPB 3.375 g     3.375 g 12.5 mL/hr over 240 Minutes Intravenous Every 8 hours 10/21/16 1831     10/21/16 1615  piperacillin-tazobactam (ZOSYN) IVPB 3.375 g  Status:  Discontinued     3.375 g 12.5 mL/hr over 240 Minutes Intravenous  Once 10/21/16 1608 10/21/16 1830   10/21/16 1430  cefTRIAXone (ROCEPHIN) 1 g in dextrose 5 % 50 mL IVPB     1 g 100 mL/hr over 30 Minutes Intravenous  Once 10/21/16 1416 10/22/16 0841      Current Facility-Administered Medications  Medication Dose Route Frequency Provider Last Rate Last Dose  . acetaminophen (TYLENOL) tablet 650 mg  650 mg Oral Q6H PRN Gouru, Aruna, MD   650 mg at 10/22/16 2101   Or  . acetaminophen (TYLENOL) suppository 650 mg  650 mg Rectal Q6H PRN Gouru, Aruna, MD      . atorvastatin (LIPITOR) tablet 10 mg  10 mg Oral QPM Gouru, Aruna, MD   10 mg at 10/22/16 1752  . benztropine (COGENTIN) tablet 1 mg  1 mg Oral BID Gouru, Aruna, MD   1 mg at 10/22/16 2101  . enoxaparin (LOVENOX) injection 40 mg  40 mg Subcutaneous Q24H Gouru, Aruna, MD   40 mg at 10/21/16 2127  . levothyroxine (SYNTHROID, LEVOTHROID) tablet 50 mcg  50 mcg Oral QAC breakfast Gouru, Aruna, MD   50 mcg at 10/22/16 0957  . OLANZapine (ZYPREXA) tablet 20 mg  20 mg Oral Daily Gouru, Aruna, MD   20 mg at  10/22/16 0957  . ondansetron (ZOFRAN) tablet 4 mg  4 mg Oral Q6H PRN Gouru, Aruna, MD       Or  . ondansetron (ZOFRAN) injection 4 mg  4 mg Intravenous Q6H PRN Gouru, Aruna, MD      . oxybutynin (DITROPAN) tablet 5 mg  5 mg Oral Daily Gouru, Aruna, MD   5 mg at 10/22/16 0957  . piperacillin-tazobactam (ZOSYN) IVPB 3.375 g  3.375 g Intravenous Tildon HuskyQ8H Gouru, Aruna, MD   Stopped at 10/23/16 [phone]  . vancomycin (VANCOCIN) IVPB 750 mg/150 ml premix  750 mg Intravenous Q12H Gouru, Deanna ArtisAruna, MD   Stopped at 10/23/16 0156     Objective: Vital signs in last 24 hours: Temp:  [98.2 F (36.8 C)-98.6 F (37 C)] 98.2 F (36.8 C) (09/04 0252) Pulse Rate:  [90-97] 97 (09/04 0252) Resp:  [18-22] 20 (09/04 0252) BP: (91-151)/(53-129) 111/53 (09/04 0252) SpO2:  [92 %-95 %] 92 % (09/04 0252)  Intake/Output from previous day: 09/03 0701 - 09/04 0700 In: 2525 [P.O.:240; I.V.:985; IV Piggyback:500] Out: 900 [Urine:900] Intake/Output this shift: No intake/output data recorded.   Physical Exam Constitutional: Well nourished. Alert and oriented, No acute distress. HEENT:  [place] AT, moist mucus membranes. Trachea midline, no masses. Cardiovascular: No clubbing, cyanosis, or edema. Respiratory: Normal respiratory effort, no increased work of breathing. GI: Abdomen is soft, non tender, non distended, no abdominal masses. Liver and spleen not palpable.  No hernias appreciated.  Stool sample for occult testing is not indicated.   GU: No CVA tenderness.  No bladder fullness or masses.   Skin: No rashes, bruises or suspicious lesions.  Runny stool surrounding the perineal area.  Staff preparing to give patient a bath.   Lymph: No cervical or inguinal adenopathy. Neurologic: Grossly intact, no focal deficits, moving all 4 extremities. Psychiatric: Normal mood and affect.  Lab Results:   Recent Labs  10/22/16 0336 10/23/16 0458  WBC 11.1* 10.8  HGB 11.7* 11.9*  HCT 33.9* 35.1  PLT 269 269   BMET  Recent  Labs  10/22/16 0336 10/23/16 0458  NA 138 136  K 3.6 4.0  CL 103 104  CO2 28 26  GLUCOSE 98 114*  BUN 16 14  CREATININE 0.58 0.61  CALCIUM 7.8* 8.0*   PT/INR  Recent Labs  10/22/16 0336  LABPROT 16.7*  INR 1.36   ABG No results for input(s): PHART, HCO3 in the last 72 hours.  Invalid input(s): PCO2, PO2  Studies/Results: Ct Head Wo Contrast  Result Date: 10/21/2016 CLINICAL DATA:  Increased confusion since a fall on 10/12/2016. EXAM: CT HEAD WITHOUT CONTRAST TECHNIQUE: Contiguous axial images were obtained from the base of the skull through the vertex without intravenous contrast. COMPARISON:  10/12/2016. FINDINGS: Brain: Diffusely enlarged ventricles and subarachnoid spaces. Patchy white matter low density in both cerebral hemispheres. No intracranial hemorrhage, mass lesion or CT evidence of acute infarction. Vascular: No hyperdense vessel or unexpected calcification. Skull: Normal. Negative for fracture or focal lesion. Sinuses/Orbits: Unremarkable. Other: None. IMPRESSION: 1. No acute abnormality. 2. Stable atrophy and chronic small vessel white matter ischemic changes. Electronically Signed   By: Beckie Salts M.D.   On: 10/21/2016 12:39   Ct Angio Chest Pe W And/or Wo Contrast  Result Date: 10/21/2016 CLINICAL DATA:  71 year old female with increased confusion after a fall on 10/12/2016. Hypoxia and abnormal D-dimer. EXAM: CT ANGIOGRAPHY CHEST WITH CONTRAST TECHNIQUE: Multidetector CT imaging of the chest was performed using the standard protocol during bolus administration of intravenous contrast. Multiplanar CT image reconstructions and MIPs were obtained to evaluate the vascular anatomy. CONTRAST:  75 mL Isovue 370 COMPARISON:  Chest radiographs 1221 hours today and earlier. CT cervical spine 10/12/2016, 04/06/2014 FINDINGS: Cardiovascular: Good contrast bolus timing in the pulmonary arterial tree. Mild respiratory motion artifact. No focal filling defect identified in the  pulmonary arteries to suggest acute pulmonary embolism. Negative visible aorta aside from mild atherosclerosis. Calcified coronary artery atherosclerosis is evident. No cardiomegaly or pericardial effusion. Mediastinum/Nodes: Negative.  No lymphadenopathy. Lungs/Pleura: Major airways are patent. There is peripheral/dependent opacity in the superior segment of the right lower lobe with mild associated peribronchial thickening. No definite consolidation. No pleural effusion. Minimal bilateral dependent atelectasis otherwise. Minimal scarring or atelectasis in the lingula. Upper Abdomen: Negative visualized liver, gallbladder, spleen, pancreas, adrenal glands and bowel in the upper abdomen. Probable left hydronephrosis (series 4, image 96). Musculoskeletal: Mild to moderate compression fractures of the anterior T1 and superior T2 vertebral bodies are new since 2016 and likely subacute (series 8, image 53). The anterior T1 body fracture is comminuted. No retropulsion, and the pedicles and posterior elements of both levels appear intact. Underlying osteopenia. Healed chronic low or sternal  fracture. No other acute osseous abnormality identified. Review of the MIP images confirms the above findings. IMPRESSION: 1.  Negative for acute pulmonary embolus. 2. Subacute T1 and T2 compression fractures. No retropulsion of bone or complicating features. 3. Nonspecific peripheral opacity in the superior segment of the right lower lobe may reflect atelectasis but developing infection is difficult to exclude. No pleural effusion. 4. Suggestion of Left Hydronephrosis. Renal Ultrasound may be the simplest way to follow up this finding, or alternatively CT Abdomen and Pelvis. 5. Calcified coronary artery atherosclerosis. Mild for age thoracic aortic atherosclerosis. Electronically Signed   By: Odessa Fleming M.D.   On: 10/21/2016 14:56   Mr Thoracic Spine Wo Contrast  Result Date: 10/22/2016 CLINICAL DATA:  Traumatic T1 and T2 fractures.   Fall 10/12/2016. EXAM: MRI THORACIC SPINE WITHOUT CONTRAST TECHNIQUE: Multiplanar, multisequence MR imaging of the thoracic spine was performed. No intravenous contrast was administered. COMPARISON:  CTA of the chest 10/21/2016 FINDINGS: Alignment:  AP alignment is anatomic. Vertebrae: Acute/subacute superior endplate fractures aunt T1 and T2 are confirmed. There is 40% loss of height at T1 and 20% loss of height at T2. Mild endplate edema is noted anteriorly and superiorly at C6. This may reflect a slight fracture. There is no retropulsed bone. No focal stenosis is evident. Marrow signal and vertebral body heights are normal throughout the remainder of the thoracic spine. Cord: Normal signal is present throughout the thoracic spine to the conus medullaris which terminates at L1-2. Paraspinal and other soft tissues: Paraspinous soft tissues are within normal limits. Limited imaging of the chest is unremarkable. Visualized upper abdomen is within normal limits. Disc levels: A central disc protrusion at T7-8 partially effaces the ventral CSF without significant stenosis. No other significant disc disease is present. Foramina are patent bilaterally. IMPRESSION: 1. Acute/subacute superior endplate compression fractures at T1 and T2 without significant retropulsion of bone. 2. Question minimally displaced anterior superior endplate fracture at C6. 3. No other focal marrow signal abnormality or edema within the thoracic spine. 4. Shallow central disc protrusion at T7-8 without significant stenosis. Electronically Signed   By: Marin Roberts M.D.   On: 10/22/2016 09:51   [place] Retroperitoneal (renal,aorta,ivc Nodes)  Result Date: 10/21/2016 CLINICAL DATA:  Hydronephrosis seen on chest CT EXAM: RENAL / URINARY TRACT ULTRASOUND COMPLETE COMPARISON:  10/21/2016 chest CT FINDINGS: Right Kidney: Length: 10.4 cm. Echogenicity within normal limits. No mass or calculus. Mild hydronephrosis without obstructing source  identified. Findings could potentially represent chronic caliectasis. Left Kidney: Length: 8.6 cm. Echogenicity within normal limits. No mass or calculus. Mild hydronephrosis is noted without obstructing etiology. Findings could represent chronic caliectasis given what appear to be ureteral jets demonstrated in the bladder. Bladder: Partially distended with ureteral jets seen. There appears to be slight thickening likely due to underdistention. Internal debris is not excluded. IMPRESSION: 1. Bilateral mild hydronephrosis or caliectasis of the renal collecting systems. 2. Nondistended bladder with slight mural thickening and/or debris along its dependent aspect, some which is likely due to underdistention. Electronically Signed   By: Tollie Eth M.D.   On: 10/21/2016 17:09   US Venous Img Lower Bilateral  Result Date: 10/21/2016 CLINICAL DATA:  71 year old female with acute bilateral lower extremity pain. EXAM: BILATERAL LOWER EXTREMITY VENOUS DOPPLER ULTRASOUND TECHNIQUE: Gray-scale sonography with graded compression, as well as color Doppler and duplex ultrasound were performed to evaluate the lower extremity deep venous systems from the level of the common femoral vein and including the common femoral, femoral, profunda  femoral, popliteal and calf veins including the posterior tibial, peroneal and gastrocnemius veins when visible. The superficial great saphenous vein was also interrogated. Spectral Doppler was utilized to evaluate flow at rest and with distal augmentation maneuvers in the common femoral, femoral and popliteal veins. COMPARISON:  None. FINDINGS: RIGHT LOWER EXTREMITY Normal flow, compressibility, and augmentation within the distal common femoral, proximal profunda femoral, proximal greater saphenous, entire femoral, popliteal veins, and imaged calf veins. LEFT LOWER EXTREMITY Normal flow, compressibility, and augmentation within the distal common femoral, proximal profunda femoral, proximal  greater saphenous, entire femoral, popliteal veins, and imaged calf veins. IMPRESSION: No evidence of DVT within either lower extremity. Electronically Signed   By: Harmon Pier M.D.   On: 10/21/2016 17:04   Dg Chest Portable 1 View  Result Date: 10/21/2016 CLINICAL DATA:  Increased confusion since a fall on 10/12/2016. Smoker. EXAM: PORTABLE CHEST 1 VIEW COMPARISON:  10/15/2016. FINDINGS: Normal sized heart. Clear lungs. Diffuse osteopenia. 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 IMPRESSION: No acute abnormality. Electronically Signed   By: Beckie Salts M.D.   On: 10/21/2016 12:36   Ct Renal Stone Study  Result Date: 10/21/2016 CLINICAL DATA:  Hydronephrosis on chest CTA EXAM: CT ABDOMEN AND PELVIS WITHOUT CONTRAST TECHNIQUE: Multidetector CT imaging of the abdomen and pelvis was performed following the standard protocol without IV contrast. COMPARISON:  None. FINDINGS: Lower chest: Mild basilar atelectasis Hepatobiliary: No focal hepatic lesion. No biliary duct dilatation. Gallbladder is normal. Common bile duct is normal. Pancreas: Pancreas is normal. No ductal dilatation. No pancreatic inflammation. Spleen: Normal spleen Adrenals/urinary tract: Adrenal glands normal. There is mild bilateral pelvicaliectasis. The LEFT and RIGHT ureter are nonobstructed with contrast excreted into the bladder on delayed imaging. There is suggestive of debris or potential endoluminal filling defect within the distal RIGHT ureter through the pelvic brim (image 53, series 6). Several additional foci of potential debris within the RIGHT renal pelvis. Stomach/Bowel: Stomach, small bowel, appendix, and cecum are normal. The colon and rectosigmoid colon are normal. Vascular/Lymphatic: Abdominal aorta is normal caliber. There is no retroperitoneal or periportal lymphadenopathy. No pelvic lymphadenopathy. Reproductive: Uterus normal. Other: No free fluid. Musculoskeletal: Bilateral hip prosthetics does generates significant streak  artifact the pelvis. IMPRESSION: 1. No evidence of high-grade renal obstruction. 2. Bilateral pelvicaliectasis. 3. Subtle Filling defects within the RIGHT ureter which could represent debris, lesion or calculus. No obstruction. Recommend either dedicated hematuria protocol for evaluation of RIGHT ureter versus retrograde pyelogram. Electronically Signed   By: Genevive Bi M.D.   On: 10/21/2016 19:36     Assessment and Plan --bilateral hydro right >left with ureteral dilation to a very thickened bladder, renal US with same and bilateral jets - s/p foley. Good UOP. Cr starting to rise again.  Patient in ultrasound at this moment for RUS.   --UTI - cx's pending     LOS: 2 days    [facility] 10/23/2016

## 2016-10-23 NOTE — Progress Notes (Signed)
OT Cancellation Note  Patient Details Name: Jenkins Rougemily Jane Ledvina MRN: [id number] DOB: 03/03/1945   Cancelled Treatment:    Reason Eval/Treat Not Completed: Medical issues which prohibited therapy. Imaging showing subacute T1 and T2 compression fx's and question minimally displaced anterior superior endplate fx at C6.  Pt pending follow-up by ortho after results of MRI. Spoke with PT and per PT's discussion with MD Sherryll BurgerShah, will hold OT until ortho follow-up and proceed as medically appropriate.  Richrd PrimeJamie Stiller, MPH, MS, OTR/L ascom [phone] 10/23/16, 9:15 AM

## 2016-10-23 NOTE — Consult Note (Addendum)
Brief Consult Note (official report to follow)  NSGY notified for report of T1/T2 vertebral body fractures s/p fall. I have reviewed the imaging and agree with the report. Fractures appear isolated to the anterior column and I do not see any disc space violation or of ligamentum flavum. I have recommended a CTO brace at all times and to obtain baseline c spine xrays in brace. If stable, will plan for 4 week follow up in clinic with xrays. Full note to follow. Thank you for your consultation.   Noralee StainJohn Barr, MD

## 2016-10-23 NOTE — Progress Notes (Addendum)
[facility] [place] [facility]   PATIENT NAME: Tina Ellison    MR#:  [id number]  DATE OF BIRTH:  1945-12-22  SUBJECTIVE:  CHIEF COMPLAINT:   Chief Complaint  Patient presents with  . Altered Mental Status  No new complaints, feeling much better REVIEW OF SYSTEMS:  Review of Systems  Constitutional: Positive for malaise/fatigue. Negative for chills, fever and weight loss.  HENT: Negative for nosebleeds and sore throat.   Eyes: Negative for blurred vision.  Respiratory: Negative for cough, shortness of breath and wheezing.   Cardiovascular: Negative for chest pain, orthopnea, leg swelling and PND.  Gastrointestinal: Negative for abdominal pain, constipation, diarrhea, heartburn, nausea and vomiting.  Genitourinary: Negative for dysuria and urgency.  Musculoskeletal: Positive for back pain.  Skin: Negative for rash.  Neurological: Positive for weakness. Negative for dizziness, speech change, focal weakness and headaches.  Endo/Heme/Allergies: Does not bruise/bleed easily.  Psychiatric/Behavioral: Negative for depression.   DRUG ALLERGIES:   Allergies  Allergen Reactions  . Haldol [Haloperidol]     Patient reported-states dry mouth. No allergies are listed on MAR   VITALS:  Blood pressure (!) 111/53, pulse 97, temperature 98.2 F (36.8 C), temperature source Oral, resp. rate 20, weight 60.1 kg (132 lb 8 oz), SpO2 92 %. PHYSICAL EXAMINATION:  Physical Exam  Constitutional: She is oriented to person, place, and time and well-developed, well-nourished, and in no distress.  HENT:  Head: Normocephalic and atraumatic.  Eyes: Pupils are equal, round, and reactive to light. Conjunctivae and EOM are normal.  Neck: Normal range of motion. Neck supple. No tracheal deviation present. No thyromegaly present.  Cardiovascular: Normal rate, regular rhythm and normal heart sounds.   Pulmonary/Chest: Effort normal and breath sounds normal. No respiratory distress. She  has no wheezes. She exhibits no tenderness.  Abdominal: Soft. Bowel sounds are normal. She exhibits no distension. There is no tenderness.  Musculoskeletal: Normal range of motion.  Neurological: She is alert and oriented to person, place, and time. No cranial nerve deficit.  Skin: Skin is warm and dry. No rash noted.  Psychiatric: Mood and affect normal.   LABORATORY PANEL:  Female CBC  Recent Labs Lab 10/23/16 0458  WBC 10.8  HGB 11.9*  HCT 35.1  PLT 269   ------------------------------------------------------------------------------------------------------------------ Chemistries   Recent Labs Lab 10/22/16 0336 10/23/16 0458  NA 138 136  K 3.6 4.0  CL 103 104  CO2 28 26  GLUCOSE 98 114*  BUN 16 14  CREATININE 0.58 0.61  CALCIUM 7.8* 8.0*  AST 13*  --   ALT 10*  --   ALKPHOS 73  --   BILITOT 0.6  --    RADIOLOGY:  [place] Retroperitoneal Ltd  Result Date: 10/23/2016 CLINICAL DATA:  Follow-up of bilateral hydronephrosis. EXAM: RENAL / URINARY TRACT ULTRASOUND COMPLETE COMPARISON:  Abdominal ultrasound of October 21, 2016 FINDINGS: Right Kidney: Length: 10.2 cm. The renal cortical echotexture is lower than that of the adjacent liver. There is moderate hydronephrosis. No stones are observed. Left Kidney: Length: 9.3 cm. The renal cortical echotexture is similar to that on the right. No hydronephrosis is observed today. Bladder: The urinary bladder is decompressed by the Foley catheter whose balloon is visible. IMPRESSION: Interval resolution of left-sided hydronephrosis. Moderate right-sided hydronephrosis persists. The urinary bladder is decompressed due to a Foley catheter. Electronically Signed   By: David  SwazilandJordan M.D.   On: 10/23/2016 09:15   ASSESSMENT AND PLAN:  Tina Florasmily Weide  is a 71 y.o.  female with a known history of COPD, GERD, hypothyroidism, schizoaffective disorder, schizophrenia and multiple other medical problems is brought into the ED from assisted living facility  for altered mental status According to the EMS report patient fell on 24th of August and was seen at [facility], at that time CT head was negative and was discharged on August 27 but her mental status is deteriorating and falling asleep a lot.  # Altered mental status secondary to sepsis -Present on admission - CT head is negative -Seems back to her baseline mental status now  #Sepsis secondary to UTI -pneumonia ruled out, normal pro-calcitonin -Narrow down antibiotics to IV Rocephin only for now -Continue IV fluids, supportive treatment  #elevated d-dimer from the underlying sepsis CT angiogram is negative for pulmonary embolism Patient has calf tenderness-Bilateral lower Extremity venous Dopplers neg for DVT  #Bilateral mild hydronephrosis: Likely due to urinary obstruction -Appreciate urology input Foley catheter placed to monitor urine output -Urology following  #Subacute T1 and T2 compression fracture -Appreciate orthopedics/neurosurgery input -CTO brace & conservative management, pain control  #chronic history of hypothyroidism continue Synthroid ; TSH is normal  DVT prophylaxis with Lovenox subcutaneous   Physical therapy evaluation -may need rehab  All the records are reviewed and case discussed with Care Management/Social Worker. Management plans discussed with the patient, nursing, Dr Teola Bradley (neurosurgery) and they are in agreement.  CODE STATUS: Full Code  TOTAL TIME TAKING CARE OF THIS PATIENT: 35 minutes.   More than 50% of the time was spent in counseling/coordination of care: YES  POSSIBLE D/C IN 1-2 DAYS, DEPENDING ON CLINICAL CONDITION.   Delfino Lovett M.D on 10/23/2016 at 2:41 PM  Between 7am to 6pm - Pager - [phone]  After 6pm go to [email or web address] - Social research officer, government  [facility] [place] [facility]  Office  [phone]  CC: Primary care physician; Duffy Rhody, FNP  Note: This dictation was prepared with Dragon  dictation along with smaller phrase technology. Any transcriptional errors that result from this process are unintentional.

## 2016-10-23 NOTE — Progress Notes (Addendum)
OT Cancellation Note  Patient Details Name: Tina Ellison MRN: [name] DOB: 1945/11/12   Cancelled Treatment:    Reason Eval/Treat Not Completed: Other (comment). Per neurosurgery, pt is pending upright c spine xrays in brace and if stable, will plan for 4 week follow up with neurosurgery for additional x-rays. Will hold OT evaluation at this time and will re-attempt OT evaluation at later date/time pending results of baseline upright c spine xrays to ensure accurate plan of care and minimize risk of further injury.  Richrd PrimeJamie Stiller, MPH, MS, OTR/L ascom [phone] 10/23/16, 2:31 PM

## 2016-10-23 NOTE — Progress Notes (Signed)
Pt awake, becoming more confused.  Pt having visual and auditory hallucinations. Reoriented, spent time with pt, playing soft music and repositioned pt to try and help her back to sleep. Tina CombsSarah Illias Pantano RN

## 2016-10-23 NOTE — Consult Note (Signed)
Referring Physician:  No referring provider defined for this encounter.  Primary Physician:  Duffy Rhody, FNP  Chief Complaint:  T1/T2 vertebral body fractures.  History of Present Illness: Tina Ellison is a 71 y.o. female who presents as a consult for T1/T2 vertebral body fractures s/p fall. Patient presented to ED on 10/21/2016 for altered mental status and was admitted for sepsis. Workup was significant for T1/T2 endplate compression fractures. Neurosurgery was consulted for evaluation. Denies back, neck, and extremity pain at this time. Ambulates at home independently.  The symptoms are causing a significant impact on the patient's life.   Review of Systems:  A 10 point review of systems is negative, except for the pertinent positives and negatives detailed in the HPI.  Past Medical History: Past Medical History:  Diagnosis Date  . Anxiety   . Anxiety disorder   . COPD (chronic obstructive pulmonary disease) (HCC)   . Depression   . GERD (gastroesophageal reflux disease)   . Hypothyroidism   . Memory loss   . Osteoporosis   . Overactive bladder   . Schizoaffective disorder (HCC)   . Schizophrenia (HCC)   . Thrombocytopenia (HCC)   . Urinary retention   . UTI (lower urinary tract infection)   . Vitamin D deficiency   . Vitamin deficiency   . Weakness     Past Surgical History: Past Surgical History:  Procedure Laterality Date  . BREAST SURGERY Right     Allergies: Allergies as of 10/21/2016 - Review Complete 10/21/2016  Allergen Reaction Noted  . Haldol [haloperidol]  05/20/2015    Medications:  Current Facility-Administered Medications:  .  acetaminophen (TYLENOL) tablet 650 mg, 650 mg, Oral, Q6H PRN, 650 mg at 10/22/16 2101 **OR** acetaminophen (TYLENOL) suppository 650 mg, 650 mg, Rectal, Q6H PRN, Gouru, Aruna, MD .  atorvastatin (LIPITOR) tablet 10 mg, 10 mg, Oral, QPM, Gouru, Aruna, MD, 10 mg at 10/22/16 1752 .  benztropine (COGENTIN) tablet 1  mg, 1 mg, Oral, BID, Gouru, Aruna, MD, 1 mg at 10/23/16 0946 .  enoxaparin (LOVENOX) injection 40 mg, 40 mg, Subcutaneous, Q24H, Gouru, Aruna, MD, 40 mg at 10/21/16 2127 .  levothyroxine (SYNTHROID, LEVOTHROID) tablet 50 mcg, 50 mcg, Oral, QAC breakfast, Gouru, Aruna, MD, 50 mcg at 10/23/16 0827 .  OLANZapine (ZYPREXA) tablet 20 mg, 20 mg, Oral, Daily, Gouru, Aruna, MD, 20 mg at 10/23/16 0946 .  ondansetron (ZOFRAN) tablet 4 mg, 4 mg, Oral, Q6H PRN **OR** ondansetron (ZOFRAN) injection 4 mg, 4 mg, Intravenous, Q6H PRN, Gouru, Aruna, MD .  oxybutynin (DITROPAN) tablet 5 mg, 5 mg, Oral, Daily, Gouru, Aruna, MD, 5 mg at 10/23/16 0946 .  piperacillin-tazobactam (ZOSYN) IVPB 3.375 g, 3.375 g, Intravenous, Q8H, Gouru, Aruna, MD, Last Rate: 12.5 mL/hr at 10/23/16 1134, 3.375 g at 10/23/16 1134 .  vancomycin (VANCOCIN) IVPB 750 mg/150 ml premix, 750 mg, Intravenous, Q12H, Gouru, Aruna, MD, Last Rate: 150 mL/hr at 10/23/16 1314, 750 mg at 10/23/16 1314   Social History: Social History  Substance Use Topics  . Smoking status: Current Every Day Smoker    Packs/day: 0.50    Types: Cigarettes  . Smokeless tobacco: Never Used  . Alcohol use No    Family Medical History: History reviewed. No pertinent family history.  Physical Examination: Vitals:   10/22/16 2100 10/23/16 0252  BP: 98/61 (!) 111/53  Pulse: 90 97  Resp:  20  Temp:  98.2 F (36.8 C)  SpO2:  92%     General: Patient is well  developed, well nourished, calm, collected, and in no apparent distress.  Psychiatric: Patient is non-anxious.  Head:  Pupils equal, round, and reactive to light.  Neck:   CTO brace in place  Respiratory: Patient is breathing without any difficulty.  Extremities: No edema.  Vascular: Palpable pulses in dorsal pedal vessels.  Skin:   On exposed skin, there are no abnormal skin lesions.  NEUROLOGICAL:  General: In no acute distress.   Awake, alert, oriented to person, place, and time.  Pupils  equal round and reactive to light.  Facial tone is symmetric.  Tongue protrusion is midline.  There is no pronator drift.  ROM of spine: not assessed.  Palpation of spine: Tender to palpation upper thoracic.    Strength: Side Biceps Triceps Deltoid Interossei Grip  R 5 5 4+ 4+ 5  L 5 5 4+ 4+ 5   Side Iliopsoas Quads Hamstring PF DF EHL  R 4- 4- 4- 5 5 5   L 4- 4- 4- 5 5 5   Strength limited from pain and effort.  Reflexes are 2+ and symmetric at the biceps, triceps, brachioradialis, patella and achilles.   Bilateral upper and lower extremity sensation is intact to light touch and pin prick.  Clonus is not present.  Toes are down-going. Gait not assessed. Hoffman's is absent.  Imaging: MRI THORACIC SPINE WITHOUT CONTRAST  TECHNIQUE: Multiplanar, multisequence MR imaging of the thoracic spine was performed. No intravenous contrast was administered.  COMPARISON:  CTA of the chest 10/21/2016  FINDINGS: Alignment:  AP alignment is anatomic.  Vertebrae: Acute/subacute superior endplate fractures aunt T1 and T2 are confirmed. There is 40% loss of height at T1 and 20% loss of height at T2. Mild endplate edema is noted anteriorly and superiorly at C6. This may reflect a slight fracture. There is no retropulsed bone. No focal stenosis is evident. Marrow signal and vertebral body heights are normal throughout the remainder of the thoracic spine.  Cord: Normal signal is present throughout the thoracic spine to the conus medullaris which terminates at L1-2.  Paraspinal and other soft tissues: Paraspinous soft tissues are within normal limits. Limited imaging of the chest is unremarkable. Visualized upper abdomen is within normal limits.  Disc levels:  A central disc protrusion at T7-8 partially effaces the ventral CSF without significant stenosis. No other significant disc disease is present. Foramina are patent bilaterally.  IMPRESSION: 1. Acute/subacute superior endplate  compression fractures at T1 and T2 without significant retropulsion of bone. 2. Question minimally displaced anterior superior endplate fracture at C6. 3. No other focal marrow signal abnormality or edema within the thoracic spine. 4. Shallow central disc protrusion at T7-8 without significant stenosis.   Electronically Signed   By: Marin Robertshristopher  Mattern M.D.   On: 10/22/2016 09:51  I have personally reviewed the images and agree with the above interpretation.  Assessment and Plan: Tina Ellison is a pleasant 71 y.o. female with T1/T2 endplate compression fractures. Per Dr. Chipper HerbBarr's recommendations, patient was to be placed in CTO brace at all times, obtain upright xrays, and follow up with neurosurgery in 4 weeks to monitor progress. Brace should be worn as instructed until fractures can be reassessed at that time.     Ivar DrapeAmanda Trenita Hulme, PA-C Dept. of Neurosurgery

## 2016-10-23 NOTE — Progress Notes (Signed)
PT Cancellation Note  Patient Details Name: Tina Ellison MRN: [id number] DOB: 1945/05/18   Cancelled Treatment:    Reason Eval/Treat Not Completed: Other (comment).  Per discussion with MD Sherryll BurgerShah, PT cleared to work with pt with CTO brace in place (before or after upright x-rays).  Information relayed to OT who attempted to see pt but pt declined to work with OT (see OT note for details).  Will re-attempt to see pt for PT eval tomorrow morning.  Tina Ellison, PT 10/23/16, 3:45 PM [phone]

## 2016-10-23 NOTE — Progress Notes (Signed)
OT Cancellation Note  Patient Details Name: Jenkins Rougemily Jane Vandeusen MRN: [id number] DOB: 1945/11/14   Cancelled Treatment:    Reason Eval/Treat Not Completed: Patient declined, no reason specified. Spoke with PT, per MD, therapy is okay to evaluate pt before or after x-rays. Upon attempt, pt initially very pleasant, CTO brace in place. Oriented to self, somewhat to situation ("I'm here because I fell") but not able to verbalize date/time, place, or where she is from. Pt reports using a RW at baseline, pain in R heel with some relief with rolled towel to float heel. When attempted to ask additional questions regarding PLOF for evaluation, pt became agitated stating, "I'm done with all this questioning." Gentle encouragement provided to support participation in preparation for upright xrays, but pt continues to decline therapy this date. Provided pt with additional blanket after indicating she was cold. Thankful for blanket and agreeable to OT coming back next date. Will re-attempt next date as pt is available/willing to participate.  Richrd PrimeJamie Stiller, MPH, MS, OTR/L ascom [phone] 10/23/16, 3:07 PM

## 2016-10-24 ENCOUNTER — Inpatient Hospital Stay: Payer: Medicare Other

## 2016-10-24 LAB — URINE CULTURE: Culture: >=100000 — AB

## 2016-10-24 NOTE — Clinical Social Work Note (Signed)
CSW spoke to [place] from group home, she said patient can return once medically ready for discharge unless PT is recommending SNF.  If patient discharges back to group home she will need FL2 with updated medications and discharge summary completed and faxed to her at [phone].  Ervin KnackEric R. Tyshawn Ciullo, MSW, LCSWA [phone]  10/24/2016 10:14 AM

## 2016-10-24 NOTE — Evaluation (Signed)
Physical Therapy Evaluation Patient Details Name: Tina Ellison MRN: [id number] DOB: November 19, 1945 Today's Date: 10/24/2016   History of Present Illness  Pt is a 71 y.o. female s/p fall 10/12/16 (seen at outside hospital) and with increased confusion since and [facility].  Imaging showing subacute T1 and T2 compression fx's and question minimally displaced anterior superior endplate fx at C6.  Pt placed in CTO brace at all times.  Also admitted for AMS secondary to sepsis (secondary to HCAP and possible UTI).  PMH includes anxiety, COPD, schizoaffective disorder, UTI, DM, h/o breast CA.  Clinical Impression  Prior to hospital admission, pt was modified independent ambulating with rollator.  [facility] (per chart).  Currently pt is mod to max assist supine to sit, mod to max assist to stand, and mod assist stand pivot bed to recliner.  Pt would benefit from skilled PT to address noted impairments and functional limitations (see below for any additional details).  Upon hospital discharge (d/t current assist levels and pt appearing below baseline in regards to functional mobility), recommend pt discharge to STR.    Follow Up Recommendations SNF    Equipment Recommendations  Rolling walker with 5" wheels    Recommendations for Other Services       Precautions / Restrictions Precautions Precautions: Fall Precaution Comments: Aspiration Required Braces or Orthoses: Spinal Brace Spinal Brace: Other (comment) Spinal Brace Comments: CTO brace at all times Restrictions Weight Bearing Restrictions: No      Mobility  Bed Mobility Overal bed mobility: Needs Assistance Bed Mobility: Supine to Sit     Supine to sit: Mod assist;HOB elevated;Max assist     General bed mobility comments: vc's and assist for logrolling to L side and then assist for trunk and B LE's sidelying to sitting; increased time to perform  Transfers Overall transfer level: Needs  assistance Equipment used: None Transfers: Sit to/from [employer]Stand;Stand Pivot Transfers Sit to Stand: Mod assist;Max assist Stand pivot transfers: Mod assist       General transfer comment: pt initially max assist to stand with B knees blocked and then mod assist next 2 trials (with B knees blocked); pt declined to use RW therapist had in room d/t wanting to use her rollator instead (not present in room)  Ambulation/Gait             General Gait Details: Deferred d/t LE weakness and difficulty shifting weight to take any steps  Stairs            Wheelchair Mobility    Modified Rankin (Stroke Patients Only)       Balance Overall balance assessment: Needs assistance;History of Falls Sitting-balance support: Bilateral upper extremity supported;Feet supported Sitting balance-Leahy Scale: Poor Sitting balance - Comments: requires UE support for sitting balance (static)   Standing balance support: Bilateral upper extremity supported Standing balance-Leahy Scale: Poor Standing balance comment: requires B UE support for static standing balance                             Pertinent Vitals/Pain Pain Assessment: No/denies pain  O2 90-91% on room air during session (nursing notified). HR WFL during session.    Home Living Family/patient expects to be discharged to:: Group home                 Additional Comments: Per chart [facility].  Pt denies any stairs  at home.    Prior Function Level of Independence: Needs assistance   Gait / Transfers Assistance Needed: Pt was modified independent ambulating with 4ww and per notes was energetic  ADL's / Homemaking Assistance Needed: Per notes pt was independent with ADL's (pt reports she has assist for bathing)        Hand Dominance        Extremity/Trunk Assessment   Upper Extremity Assessment Upper Extremity Assessment: Generalized weakness    Lower Extremity  Assessment Lower Extremity Assessment: Generalized weakness       Communication   Communication: No difficulties  Cognition Arousal/Alertness: Awake/alert Behavior During Therapy: WFL for tasks assessed/performed Overall Cognitive Status: No family/caregiver present to determine baseline cognitive functioning (Oriented to person, place, situation, month)                                        General Comments General comments (skin integrity, edema, etc.): CTO in place upon PT arrival.  Nursing cleared pt for participation in physical therapy.  Pt agreeable to PT session.    Exercises  Transfer training   Assessment/Plan    PT Assessment Patient needs continued PT services  PT Problem List Decreased strength;Decreased activity tolerance;Decreased balance;Decreased mobility;Decreased knowledge of use of DME;Decreased knowledge of precautions       PT Treatment Interventions DME instruction;Gait training;Functional mobility training;Therapeutic activities;Therapeutic exercise;Balance training;Patient/family education    PT Goals (Current goals can be found in the Care Plan section)  Acute Rehab PT Goals Patient Stated Goal: to be able to walk again PT Goal Formulation: With patient Time For Goal Achievement: 11/07/16 Potential to Achieve Goals: Good    Frequency 7X/week   Barriers to discharge Decreased caregiver support      Co-evaluation               AM-PAC PT "6 Clicks" Daily Activity  Outcome Measure Difficulty turning over in bed (including adjusting bedclothes, sheets and blankets)?: Unable Difficulty moving from lying on back to sitting on the side of the bed? : Unable Difficulty sitting down on and standing up from a chair with arms (e.g., wheelchair, bedside commode, etc,.)?: Unable Help needed moving to and from a bed to chair (including a wheelchair)?: A Lot Help needed walking in hospital room?: Total Help needed climbing 3-5 steps  with a railing? : Total 6 Click Score: 7    End of Session Equipment Utilized During Treatment: Gait belt;Other (comment) (CTO brace) Activity Tolerance: Patient tolerated treatment well Patient left: in chair;with call bell/phone within reach;with chair alarm set Nurse Communication: Mobility status;Precautions PT Visit Diagnosis: Other abnormalities of gait and mobility (R26.89);Muscle weakness (generalized) (M62.81);History of falling (Z91.81)    Time: 1610-9604 PT Time Calculation (min) (ACUTE ONLY): 33 min   Charges:   PT Evaluation $PT Eval Low Complexity: 1 Low PT Treatments $Therapeutic Activity: 8-22 mins   PT G Codes:   PT G-Codes **NOT FOR INPATIENT CLASS** Functional Assessment Tool Used: AM-PAC 6 Clicks Basic Mobility Functional Limitation: Mobility: Walking and moving around Mobility: Walking and Moving Around Current Status (V4098): At least 80 percent but less than 100 percent impaired, limited or restricted Mobility: Walking and Moving Around Goal Status [phone]): At least 1 percent but less than 20 percent impaired, limited or restricted    [facility], PT 10/24/16, 1:16 PM [phone]

## 2016-10-24 NOTE — Plan of Care (Addendum)
error 

## 2016-10-24 NOTE — Clinical Social Work Note (Addendum)
Passar number has been received, 30 day Passar number is [id number] E expires 11/23/16.  CSW spoke to patient's legal guardian Tina Ellison, [phone], she has agreed to have [place]  CSW awaiting bed offers for patient, CSW to continue to follow patient's progress throughout discharge planning.  Tina KnackEric R. Tina Ellison, MSW, Tina MajorsLCSWA [phone]  10/24/2016 2:50 PM

## 2016-10-24 NOTE — Progress Notes (Signed)
Pt refusing lab work.  Explained to pt reason for blood draw but pt is not persuaded. Henriette CombsSarah Willistine Ferrall RN

## 2016-10-24 NOTE — Evaluation (Signed)
Occupational Therapy Evaluation Patient Details Name: Tina Ellison MRN: [id number] DOB: Jun 17, 1945 Today's Date: 10/24/2016    History of Present Illness Pt is a 71 y.o. female s/p fall 10/12/16 (seen at outside hospital) and with increased confusion since and admitted to [facility] 10/21/16.  Imaging showing subacute T1 and T2 compression fx's and question minimally displaced anterior superior endplate fx at C6.  Pt placed in CTO brace at all times.  Also admitted for AMS secondary to sepsis (secondary to HCAP and possible UTI).  PMH includes anxiety, COPD, schizoaffective disorder, UTI, DM, h/o breast CA.   Clinical Impression   Pt seen for OT evaluation this date. Prior to hospital admission, pt living in group home, using a rollator for ambulation, and with staff managing medications. Pt is poor historian and no caregivers present, but pt reporting she was able to perform basic self care tasks independently and only endorses 1 fall in 12 months leading to this hospitalization. Currently pt requires mod-max assist for transfers and mobility, mod assist for LB dressing with verbal cues for sequencing/problem solving, with impaired balance, strength, problem solving, and safety awareness impacting her ability to care for herself and at an increased falls risk.  Pt would benefit from skilled OT to address noted impairments and functional limitations (see below for any additional details).  Upon hospital discharge, recommend pt discharge to STR prior to safe return to group home.    Follow Up Recommendations  SNF    Equipment Recommendations  3 in 1 bedside commode    Recommendations for Other Services       Precautions / Restrictions Precautions Precautions: Fall Precaution Comments: Aspiration Required Braces or Orthoses: Spinal Brace Spinal Brace: Other (comment) Spinal Brace Comments: CTO brace at all times Restrictions Weight Bearing Restrictions: No      Mobility Bed Mobility      General bed mobility comments: deferred, pt up in recliner for session, declining mobility; per PT evaluation earlier, pt requiring mod-max assist with VC's for supine>sit EOB.  Transfers       General transfer comment: pt declining mobility; per PT evaluation earlier, pt required max then mod assist with B knees blocked with HHA as pt declined 2WW    Balance Overall balance assessment: Needs assistance;History of Falls Sitting-balance support: Bilateral upper extremity supported;Feet supported Sitting balance-Leahy Scale: Poor Sitting balance - Comments: requires UE support for sitting balance (static)                             ADL either performed or assessed with clinical judgement   ADL Overall ADL's : Needs assistance/impaired Eating/Feeding: Sitting;Set up   Grooming: Sitting;Set up Grooming Details (indicate cue type and reason): set up for washing hands/face, oral care; required assist to detangle hair with comb, CTO brace impeding ability to comb hair well. Upper Body Bathing: Minimal assistance;Sitting   Lower Body Bathing: Moderate assistance;Sitting/lateral leans   Upper Body Dressing : Set up;Minimal assistance;Sitting   Lower Body Dressing: Sitting/lateral leans;Moderate assistance Lower Body Dressing Details (indicate cue type and reason): supervision and verbal cue to initiate doffing sock while seated in recliner with BLE elevated with leg rest, requiring verbal cue to problem solve/sequence and mod assist to initiate donning sock as pt states "I don't know how to get it back on."   Toilet Transfer Details (indicate cue type and reason): deferred due to safety/pt too weak to attempt  Vision Baseline Vision/History: Wears glasses Wears Glasses: At all times Patient Visual Report: No change from baseline       Perception     Praxis      Pertinent Vitals/Pain Pain Assessment: No/denies pain     Hand Dominance      Extremity/Trunk Assessment Upper Extremity Assessment Upper Extremity Assessment: Generalized weakness   Lower Extremity Assessment Lower Extremity Assessment: Generalized weakness       Communication Communication Communication: No difficulties   Cognition Arousal/Alertness: Awake/alert Behavior During Therapy: WFL for tasks assessed/performed Overall Cognitive Status: No family/caregiver present to determine baseline cognitive functioning                                 General Comments: Oriented to person, place, situation, month, follows commands, easily distracted, requires ocassional verbal cues for problem solving for sequential tasks.   General Comments  CTO in place throughout session    Exercises     Shoulder Instructions      Home Living Family/patient expects to be discharged to:: Group home                                 Additional Comments: Per chart pt lives at [facility].  Pt denies any stairs at home.      Prior Functioning/Environment Level of Independence: Needs assistance  Gait / Transfers Assistance Needed: Pt was modified independent ambulating with 4ww and per notes was energetic ADL's / Homemaking Assistance Needed: Per notes pt was independent with ADL's (pt reports she has assist for bathing and med mgt)   Comments: Endorses 1 fall in past 12 months leading to this admission        OT Problem List: Decreased activity tolerance;Decreased safety awareness;Decreased knowledge of precautions;Impaired balance (sitting and/or standing);Decreased knowledge of use of DME or AE;Decreased strength      OT Treatment/Interventions: Self-care/ADL training;Therapeutic exercise;Therapeutic activities;DME and/or AE instruction;Patient/family education    OT Goals(Current goals can be found in the care plan section) Acute Rehab OT Goals Patient Stated Goal: to be able to walk again OT Goal Formulation: With  patient Time For Goal Achievement: 11/07/16 Potential to Achieve Goals: Good  OT Frequency: Min 1X/week   Barriers to D/C:            Co-evaluation              AM-PAC PT "6 Clicks" Daily Activity     Outcome Measure Help from another person eating meals?: A Little Help from another person taking care of personal grooming?: A Little Help from another person toileting, which includes using toliet, bedpan, or urinal?: A Lot Help from another person bathing (including washing, rinsing, drying)?: A Lot Help from another person to put on and taking off regular upper body clothing?: A Little Help from another person to put on and taking off regular lower body clothing?: A Lot 6 Click Score: 15   End of Session    Activity Tolerance: Patient tolerated treatment well Patient left: in chair;with call bell/phone within reach;with chair alarm set  OT Visit Diagnosis: Other abnormalities of gait and mobility (R26.89);Muscle weakness (generalized) (M62.81);History of falling (Z91.81)                Time: 1610-9604 OT Time Calculation (min): 22 min Charges:  OT General Charges $OT Visit: 1 Visit OT  Evaluation $OT Eval Moderate Complexity: 1 Mod G-Codes: OT G-codes **NOT FOR INPATIENT CLASS** Functional Assessment Tool Used: AM-PAC 6 Clicks Daily Activity;Clinical judgement Functional Limitation: Self care Self Care Current Status (Z6109(G8987): At least 40 percent but less than 60 percent impaired, limited or restricted Self Care Goal Status (U0454(G8988): At least 20 percent but less than 40 percent impaired, limited or restricted   Richrd PrimeJamie Stiller, MPH, MS, OTR/L ascom [phone] 10/24/16, 1:00 PM

## 2016-10-24 NOTE — Care Management Important Message (Addendum)
Important Message  Patient Details  Name: Tina Ellison MRN: [id number] DateJenkins Rouge of Birth: 01/27/1946   Medicare Important Message Given:  Yes  Telephone call to Gordy SaversKiley Moore, legal guardian, at [employer]    Gwenette GreetBrenda S Mercedez Boule, [place]RN 10/24/2016, 7:58 AM

## 2016-10-24 NOTE — Progress Notes (Signed)
[facility] - [place] at [facility]   PATIENT NAME: Tina Ellison    MR#:  [id number]  DATE OF BIRTH:  1945/07/15  SUBJECTIVE:  CHIEF COMPLAINT:   Chief Complaint  Patient presents with  . Altered Mental Status  No new complaints, feeling much better REVIEW OF SYSTEMS:  Review of Systems  Constitutional: Positive for malaise/fatigue. Negative for chills, fever and weight loss.  HENT: Negative for nosebleeds and sore throat.   Eyes: Negative for blurred vision.  Respiratory: Negative for cough, shortness of breath and wheezing.   Cardiovascular: Negative for chest pain, orthopnea, leg swelling and PND.  Gastrointestinal: Negative for abdominal pain, constipation, diarrhea, heartburn, nausea and vomiting.  Genitourinary: Negative for dysuria and urgency.  Musculoskeletal: Positive for back pain.  Skin: Negative for rash.  Neurological: Positive for weakness. Negative for dizziness, speech change, focal weakness and headaches.  Endo/Heme/Allergies: Does not bruise/bleed easily.  Psychiatric/Behavioral: Negative for depression.   DRUG ALLERGIES:   Allergies  Allergen Reactions  . Haldol [Haloperidol]     Patient reported-states dry mouth. No allergies are listed on MAR   VITALS:  Blood pressure 106/62, pulse 90, temperature 98.4 F (36.9 C), temperature source Oral, resp. rate 20, weight 60.1 kg (132 lb 8 oz), SpO2 90 %. PHYSICAL EXAMINATION:  Physical Exam  Constitutional: She is oriented to person, place, and time and well-developed, well-nourished, and in no distress.  HENT:  Head: Normocephalic and atraumatic.  Eyes: Pupils are equal, round, and reactive to light. Conjunctivae and EOM are normal.  Neck: Normal range of motion. Neck supple. No tracheal deviation present. No thyromegaly present.  Cardiovascular: Normal rate, regular rhythm and normal heart sounds.   Pulmonary/Chest: Effort normal and breath sounds normal. No respiratory distress. She has  no wheezes. She exhibits no tenderness.  Abdominal: Soft. Bowel sounds are normal. She exhibits no distension. There is no tenderness.  Musculoskeletal: Normal range of motion.  Neurological: She is alert and oriented to person, place, and time. No cranial nerve deficit.  Skin: Skin is warm and dry. No rash noted.  Psychiatric: Mood and affect normal.   LABORATORY PANEL:  Female CBC  Recent Labs Lab 10/23/16 0458  WBC 10.8  HGB 11.9*  HCT 35.1  PLT 269   ------------------------------------------------------------------------------------------------------------------ Chemistries   Recent Labs Lab 10/22/16 0336 10/23/16 0458  NA 138 136  K 3.6 4.0  CL 103 104  CO2 28 26  GLUCOSE 98 114*  BUN 16 14  CREATININE 0.58 0.61  CALCIUM 7.8* 8.0*  AST 13*  --   ALT 10*  --   ALKPHOS 73  --   BILITOT 0.6  --    RADIOLOGY:  Dg Thoracic Spine 2 View  Result Date: 10/24/2016 CLINICAL DATA:  Upper back pain since a fall October 12, 2016 EXAM: THORACIC SPINE 2 VIEWS COMPARISON:  MRI of the thoracic spine of October 22, 2016 FINDINGS: On today's study known T1 and T2 superior endplate compressions are not clearly evident. The other visualized thoracic vertebral bodies are preserved in height. The disc space heights exhibit only minimal narrowing at multiple levels. No abnormal paravertebral soft tissue densities are observed. IMPRESSION: There is very limited visualization of T1 and T2 and the cervicothoracic junction where there are known superior endplate compressions. No additional compression fracture or other acute bony abnormality is observed. Electronically Signed   By: David  [place] M.D.   On: 10/24/2016 07:29   ASSESSMENT AND PLAN:  Tina Ellison  is a  71 y.o. female with a known history of COPD, GERD, hypothyroidism, schizoaffective disorder, schizophrenia and multiple other medical problems is brought into the ED from assisted living facility for altered mental status According to  the EMS report patient fell on 24th of August and was [facility], at that time CT head was negative and was discharged on August 27 but her mental status is deteriorating and falling asleep a lot.  # Altered mental status secondary to sepsis -Present on admission - CT head is negative -Seems back to her baseline mental status now  #Sepsis secondary to UTI -pneumonia ruled out, normal pro-calcitonin -Stopping all antibiotics  #elevated d-dimer from the underlying sepsis CT angiogram is negative for pulmonary embolism Patient has calf tenderness-Bilateral lower Extremity venous Dopplers neg for DVT  #Bilateral mild hydronephrosis: Likely due to urinary obstruction -Appreciate urology input Foley catheter placed to monitor urine output -Urology following  #Subacute T1 and T2 compression fracture -Appreciate orthopedics/neurosurgery input -CTO brace & conservative management, pain control  #chronic history of hypothyroidism continue Synthroid ; TSH is normal  DVT prophylaxis with Lovenox subcutaneous   Physical therapy evaluation -may need rehab  All the records are reviewed and case discussed with Care Management/Social Worker. Management plans discussed with the patient, nursing, and they are in agreement.  CODE STATUS: Full Code  TOTAL TIME TAKING CARE OF THIS PATIENT: 35 minutes.   More than 50% of the time was spent in counseling/coordination of care: YES  POSSIBLE D/C IN 1-2 DAYS, DEPENDING ON CLINICAL CONDITION.   Delfino LovettVipul Shaleah Nissley M.D on 10/24/2016 at 3:37 PM  Between 7am to 6pm - Pager - [phone]  After 6pm go to [email or web address] - Social research officer, governmentpassword EPAS [email or web address]  [facility] [place] [facility]  Office  [phone]  CC: Primary care physician; Duffy RhodyPartridge, Tanillya, FNP  Note: This dictation was prepared with Dragon dictation along with smaller phrase technology. Any transcriptional errors that result from this process are unintentional.

## 2016-10-25 ENCOUNTER — Telehealth: Payer: Self-pay | Admitting: Urology

## 2016-10-25 ENCOUNTER — Other Ambulatory Visit: Payer: Self-pay | Admitting: Urology

## 2016-10-25 DIAGNOSIS — N3289 Other specified disorders of bladder: Secondary | ICD-10-CM

## 2016-10-25 DIAGNOSIS — R9341 Abnormal radiologic findings on diagnostic imaging of renal pelvis, ureter, or bladder: Secondary | ICD-10-CM

## 2016-10-25 DIAGNOSIS — N1330 Unspecified hydronephrosis: Secondary | ICD-10-CM

## 2016-10-25 NOTE — Telephone Encounter (Signed)
-----   Message from Hildred LaserBrian James Budzyn, MD sent at 10/25/2016  8:38 AM EDT ----- Patient needs  -NV for TOV next week -CT Hematuria protocol in 3-4 weeks (ordered already) -cysto with any MD after CT in about 4-5 weeks.  Thanks

## 2016-10-25 NOTE — Progress Notes (Signed)
Medications administered by student RN [id number] with supervision of Clinical Instructor Briggitte Boline MSN, RN-BC.  

## 2016-10-25 NOTE — Clinical Social Work Note (Signed)
CSW spoke to patient's legal guardian Gordy SaversKiley Moore, [phone] and [facility] to for short term rehab.  CSW contacted SNF and they can accept patient on Friday, once they complete proper paperwork with DSS.  CSW to continue to follow patient's progress throughout discharge planning.  Ervin KnackEric R. Race Latour, MSW, Theresia MajorsLCSWA [phone]  10/25/2016 3:16 PM

## 2016-10-25 NOTE — Progress Notes (Signed)
[facility] - [place] at [facility]   PATIENT NAME: Tina Ellison    MR#:  [id number]  DATE OF BIRTH:  1945/10/27  SUBJECTIVE:  CHIEF COMPLAINT:   Chief Complaint  Patient presents with  . Altered Mental Status  No new complaints, feeling much better.  Pleased with her care, not liking the back brace REVIEW OF SYSTEMS:  Review of Systems  Constitutional: Positive for malaise/fatigue. Negative for chills, fever and weight loss.  HENT: Negative for nosebleeds and sore throat.   Eyes: Negative for blurred vision.  Respiratory: Negative for cough, shortness of breath and wheezing.   Cardiovascular: Negative for chest pain, orthopnea, leg swelling and PND.  Gastrointestinal: Negative for abdominal pain, constipation, diarrhea, heartburn, nausea and vomiting.  Genitourinary: Negative for dysuria and urgency.  Musculoskeletal: Positive for back pain.  Skin: Negative for rash.  Neurological: Positive for weakness. Negative for dizziness, speech change, focal weakness and headaches.  Endo/Heme/Allergies: Does not bruise/bleed easily.  Psychiatric/Behavioral: Negative for depression.   DRUG ALLERGIES:   Allergies  Allergen Reactions  . Haldol [Haloperidol]     Patient reported-states dry mouth. No allergies are listed on MAR   VITALS:  Blood pressure (!) 96/55, pulse 82, temperature 98.6 F (37 C), temperature source Oral, resp. rate 20, weight 60.1 kg (132 lb 8 oz), SpO2 92 %. PHYSICAL EXAMINATION:  Physical Exam  Constitutional: She is oriented to person, place, and time and well-developed, well-nourished, and in no distress.  HENT:  Head: Normocephalic and atraumatic.  Eyes: Pupils are equal, round, and reactive to light. Conjunctivae and EOM are normal.  Neck: Normal range of motion. Neck supple. No tracheal deviation present. No thyromegaly present.  Cardiovascular: Normal rate, regular rhythm and normal heart sounds.   Pulmonary/Chest: Effort normal and  breath sounds normal. No respiratory distress. She has no wheezes. She exhibits no tenderness.  Abdominal: Soft. Bowel sounds are normal. She exhibits no distension. There is no tenderness.  Musculoskeletal: Normal range of motion.  CTO brace in place  Neurological: She is alert and oriented to person, place, and time. No cranial nerve deficit.  Skin: Skin is warm and dry. No rash noted.  Psychiatric: Mood and affect normal.   LABORATORY PANEL:  Female CBC  Recent Labs Lab 10/23/16 0458  WBC 10.8  HGB 11.9*  HCT 35.1  PLT 269   ------------------------------------------------------------------------------------------------------------------ Chemistries   Recent Labs Lab 10/22/16 0336 10/23/16 0458  NA 138 136  K 3.6 4.0  CL 103 104  CO2 28 26  GLUCOSE 98 114*  BUN 16 14  CREATININE 0.58 0.61  CALCIUM 7.8* 8.0*  AST 13*  --   ALT 10*  --   ALKPHOS 73  --   BILITOT 0.6  --    RADIOLOGY:  No results found. ASSESSMENT AND PLAN:  Tina Florasmily Seif  is a 71 y.o. female with a known history of COPD, GERD, hypothyroidism, schizoaffective disorder, schizophrenia and multiple other medical problems is brought into the ED from assisted living facility for altered mental status According to the EMS report patient fell on 24th of August and was seen at [facility], at that time CT head was negative and was discharged on August 27 but her mental status is deteriorating and falling asleep a lot.  # Altered mental status secondary to sepsis -Present on admission - CT head is negative -Seems back to her baseline mental status now  #Sepsis secondary to UTI -pneumonia ruled out, normal pro-calcitonin -Stopping all  antibiotics  #elevated d-dimer from the underlying sepsis CT angiogram is negative for pulmonary embolism Patient has calf tenderness-Bilateral lower Extremity venous Dopplers neg for DVT  #Bilateral mild hydronephrosis: Likely due to urinary  obstruction -Left-sided hydronephrosis has resolved status post Foley - Right hydronephrosis persists - Also Right uretereal filling defect -continue foley on discharge as per urology - outpatient trial of void -will need CT Urogram in 3-4 weeks to further evaluate filling defect -will plan for outpatient cysto in 4 weeks after CT  #Subacute T1 and T2 compression fracture -Appreciate orthopedics/neurosurgery input -CTO brace & conservative management, pain control  #chronic history of hypothyroidism continue Synthroid ; TSH is normal  DVT prophylaxis with Lovenox subcutaneous   Physical therapy evaluation - STR/SNF, due to level 2 passer it may take longer place her care management/social worker  All the records are reviewed and case discussed with Care Management/Social Worker. Management plans discussed with the patient, nursing, and they are in agreement.  CODE STATUS: Full Code  TOTAL TIME TAKING CARE OF THIS PATIENT: 35 minutes.   More than 50% of the time was spent in counseling/coordination of care: YES  POSSIBLE D/C IN 1-2 DAYS, DEPENDING ON CLINICAL CONDITION.   Delfino Lovett M.D on 10/25/2016 at 12:18 PM  Between 7am to 6pm - Pager - [phone]  After 6pm go to [email or web address] - Social research officer, government  [facility] [place] [facility]  Office  [phone]  CC: Primary care physician; Duffy Rhody, FNP  Note: This dictation was prepared with Dragon dictation along with smaller phrase technology. Any transcriptional errors that result from this process are unintentional.

## 2016-10-25 NOTE — Telephone Encounter (Signed)
Done ° ° °Tina Ellison °

## 2016-10-25 NOTE — Progress Notes (Signed)
PT Cancellation Note  Patient Details Name: Tina Ellison MRN: [id number] DOB: February 07, 1946   Cancelled Treatment:    Reason Eval/Treat Not Completed: Other (comment).  Pt sleeping upon PT entry and woken with vc's.  Pt briefly woke up but reported that she was too tired to participate in PT currently and appeared to immediately fall back asleep.  Will re-attempt PT treatment session at a later date/time.  Hendricks LimesEmily Kimothy Kishimoto, PT 10/25/16, 1:55 PM [phone]

## 2016-10-25 NOTE — Progress Notes (Signed)
Urology Consult Follow Up  Subjective: No events overnight. Foley in place. No complaints  Anti-infectives: Anti-infectives    Start     Dose/Rate Route Frequency Ordered Stop   10/23/16 1800  cefTRIAXone (ROCEPHIN) 1 g in dextrose 5 % 50 mL IVPB  Status:  Discontinued     1 g 100 mL/hr over 30 Minutes Intravenous Every 24 hours 10/23/16 1412 10/24/16 1335   10/22/16 0100  vancomycin (VANCOCIN) IVPB 750 mg/150 ml premix  Status:  Discontinued     750 mg 150 mL/hr over 60 Minutes Intravenous Every 12 hours 10/21/16 1802 10/23/16 1412   10/21/16 1900  vancomycin (VANCOCIN) IVPB 750 mg/150 ml premix     750 mg 150 mL/hr over 60 Minutes Intravenous  Once 10/21/16 1801 10/21/16 2137   10/21/16 1900  piperacillin-tazobactam (ZOSYN) IVPB 3.375 g  Status:  Discontinued     3.375 g 12.5 mL/hr over 240 Minutes Intravenous Every 8 hours 10/21/16 1831 10/23/16 1412   10/21/16 1615  piperacillin-tazobactam (ZOSYN) IVPB 3.375 g  Status:  Discontinued     3.375 g 12.5 mL/hr over 240 Minutes Intravenous  Once 10/21/16 1608 10/21/16 1830   10/21/16 1430  cefTRIAXone (ROCEPHIN) 1 g in dextrose 5 % 50 mL IVPB     1 g 100 mL/hr over 30 Minutes Intravenous  Once 10/21/16 1416 10/22/16 0841      Current Facility-Administered Medications  Medication Dose Route Frequency Provider Last Rate Last Dose  . acetaminophen (TYLENOL) tablet 650 mg  650 mg Oral Q6H PRN Gouru, Aruna, MD   650 mg at 10/24/16 2236   Or  . acetaminophen (TYLENOL) suppository 650 mg  650 mg Rectal Q6H PRN Gouru, Aruna, MD      . atorvastatin (LIPITOR) tablet 10 mg  10 mg Oral QPM Gouru, Aruna, MD   10 mg at 10/24/16 1738  . benztropine (COGENTIN) tablet 1 mg  1 mg Oral BID Gouru, Aruna, MD   1 mg at 10/24/16 2237  . divalproex (DEPAKOTE ER) 24 hr tablet 500 mg  500 mg Oral BID Delfino Lovett, MD   500 mg at 10/24/16 2236  . enoxaparin (LOVENOX) injection 40 mg  40 mg Subcutaneous Q24H Gouru, Aruna, MD   40 mg at 10/23/16 2049  .  levothyroxine (SYNTHROID, LEVOTHROID) tablet 50 mcg  50 mcg Oral QAC breakfast Gouru, Aruna, MD   50 mcg at 10/24/16 1100  . OLANZapine (ZYPREXA) tablet 20 mg  20 mg Oral Daily Gouru, Aruna, MD   20 mg at 10/24/16 1100  . ondansetron (ZOFRAN) tablet 4 mg  4 mg Oral Q6H PRN Gouru, Aruna, MD       Or  . ondansetron (ZOFRAN) injection 4 mg  4 mg Intravenous Q6H PRN Gouru, Aruna, MD      . oxybutynin (DITROPAN) tablet 5 mg  5 mg Oral Daily Gouru, Aruna, MD   5 mg at 10/24/16 1100     Objective: Vital signs in last 24 hours: Temp:  [97.9 F (36.6 C)-98.6 F (37 C)] 98.6 F (37 C) (09/06 0745) Pulse Rate:  [75-92] 82 (09/06 0745) Resp:  [20] 20 (09/06 0745) BP: (86-161)/(48-123) 96/55 (09/06 0745) SpO2:  [92 %-94 %] 92 % (09/06 0745)  Intake/Output from previous day: 09/05 0701 - 09/06 0700 In: 360 [P.O.:360] Out: 1425 [Urine:1425] Intake/Output this shift: Total I/O In: -  Out: 450 [Urine:450]   Physical Exam Constitutional: Well nourished. Alert and oriented, No acute distress. HEENT: [place] AT, moist mucus membranes. Trachea  midline, no masses. Cardiovascular: No clubbing, cyanosis, or edema. Respiratory: Normal respiratory effort, no increased work of breathing. GI: Abdomen is soft, non tender, non distended, no abdominal masses. Liver and spleen not palpable.  No hernias appreciated.  Stool sample for occult testing is not indicated.   GU: No CVA tenderness.  Foley in place.  Clear urine Skin: No rashes, bruises or suspicious lesions.  Runny stool surrounding the perineal area.  Staff preparing to give patient a bath.   Lymph: No cervical or inguinal adenopathy. Neurologic: Grossly intact, no focal deficits, moving all 4 extremities. Psychiatric: Normal mood and affect.  Lab Results:   Recent Labs  10/23/16 0458  WBC 10.8  HGB 11.9*  HCT 35.1  PLT 269   BMET  Recent Labs  10/23/16 0458  NA 136  K 4.0  CL 104  CO2 26  GLUCOSE 114*  BUN 14  CREATININE 0.61   CALCIUM 8.0*   PT/INR No results for input(s): LABPROT, INR in the last 72 hours. ABG No results for input(s): PHART, HCO3 in the last 72 hours.  Invalid input(s): PCO2, PO2  Studies/Results: Dg Thoracic Spine 2 View  Result Date: 10/24/2016 CLINICAL DATA:  Upper back pain since a fall October 12, 2016 EXAM: THORACIC SPINE 2 VIEWS COMPARISON:  MRI of the thoracic spine of October 22, 2016 FINDINGS: On today's study known T1 and T2 superior endplate compressions are not clearly evident. The other visualized thoracic vertebral bodies are preserved in height. The disc space heights exhibit only minimal narrowing at multiple levels. No abnormal paravertebral soft tissue densities are observed. IMPRESSION: There is very limited visualization of T1 and T2 and the cervicothoracic junction where there are known superior endplate compressions. No additional compression fracture or other acute bony abnormality is observed. Electronically Signed   By: David  SwazilandJordan M.D.   On: 10/24/2016 07:29   [facility]  Result Date: 10/23/2016 CLINICAL DATA:  Follow-up of bilateral hydronephrosis. EXAM: RENAL / URINARY TRACT ULTRASOUND COMPLETE COMPARISON:  Abdominal ultrasound of October 21, 2016 FINDINGS: Right Kidney: Length: 10.2 cm. The renal cortical echotexture is lower than that of the adjacent liver. There is moderate hydronephrosis. No stones are observed. Left Kidney: Length: 9.3 cm. The renal cortical echotexture is similar to that on the right. No hydronephrosis is observed today. Bladder: The urinary bladder is decompressed by the Foley catheter whose balloon is visible. IMPRESSION: Interval resolution of left-sided hydronephrosis. Moderate right-sided hydronephrosis persists. The urinary bladder is decompressed due to a Foley catheter. Electronically Signed   By: David  SwazilandJordan M.D.   On: 10/23/2016 09:15     CT and u/s reviewed. L hydro resolved. Persistent right hydro. Thickened bladder wall.  Filling defect right ureter on CT stone protocal   Assessment and Plan 1. Right hydronephrosis 2. Resolved left hydronephrosis 3. Right uretereal filling defect -continue foley. Will arrange for outpatient trial of void -will need CT Urogram in 3-4 weeks to further evaluate filling defect -will plan for outpatient cysto in 4 weeks after CT -ok for d/c home with foley from urology standpoint    LOS: 4 days    Cloyde ReamsBrian James [facility] Center[facility]yn 10/25/2016

## 2016-10-26 NOTE — Progress Notes (Signed)
EMS present for pt discharge; discharge packet given to EMS personnel; pt discharged via stretcher by EMS to [facility] 

## 2016-10-26 NOTE — Progress Notes (Signed)
MD order received to discharge pt to SNF ([facility]) today; Care Management previously prepared discharge packet for EMS personnel to take to the facility; report called to [facility] [phone], spoke to [place], no further questions voiced

## 2016-10-26 NOTE — Discharge Instructions (Signed)
Vertebral Fracture A vertebral fracture means that one of the bones in the spine is broken (fractured). These bones are called vertebrae. You may have back pain that gets worse when you move. Vertebral fractures can be mild or severe. Many will get better without surgery. Surgery may be needed for more severe breaks. Follow these instructions at home: General instructions  Take medicines only as told by your doctor.  Do not drive or use heavy machinery while taking pain medicine.  If told, put ice on the injured area: ? Put ice in a plastic bag. ? Place a towel between your skin and the bag. ? Leave the ice on for 30 minutes every 2 hours at first. Then use the ice as needed.  Wear your neck brace or back brace as told by your doctor.  Do not drink alcohol.  Keep all follow-up visits as told by your doctor. This is important. Activity  Stay in bed (on bed rest) only as told by your doctor. Being on bed rest for too long can make your condition worse.  Return to your normal activities as told by your doctor. Ask your doctor what is safe for you to do.  Do exercises for your back (physical therapy) as told by your doctor.  Exercise often as told by your doctor. Contact a doctor if:  You have a fever.  You have a cough that makes your pain worse.  Your pain medicine is not helping.  Your pain does not get better over time.  You cannot return to your normal activities as planned. Get help right away if:  Your pain is very bad and it suddenly gets worse.  You are not able to move any body part (paralysis) that is below the level of your injury.  You have numbness, tingling, or weakness in any body part that is below the level of your injury.  You cannot control when you pee (urinate) or when you poop (have bowel movements). This information is not intended to replace advice given to you by your health care provider. Make sure you discuss any questions you have with your  health care provider. Document Released: 07/26/2009 Document Revised: 07/14/2015 Document Reviewed: 02/10/2014 Elsevier Interactive Patient Education  2018 [employer] Inc[employer]  

## 2016-10-26 NOTE — Progress Notes (Signed)
EMS called for nonemergency transport from Room 110 to [facility] Room 501; pt's discharge pending arrival of EMS for discharge

## 2016-10-26 NOTE — Clinical Social Work Placement (Signed)
   CLINICAL SOCIAL WORK PLACEMENT  NOTE  Date:  10/26/2016  Patient Details  Name: Tina Ellison MRN: [id number] Date of Birth: 03/06/1945  Clinical Social Work is seeking post-discharge placement for this patient at the Skilled  Nursing Facility level of care (*CSW will initial, date and re-position this form in  chart as items are completed):  Yes   Patient/family provided with [place] Clinical Social Work Department's list of facilities offering this level of care within the geographic area requested by the patient (or if unable, by the patient's family).  Yes   Patient/family informed of their freedom to choose among providers that offer the needed level of care, that participate in Medicare, Medicaid or managed care program needed by the patient, have an available bed and are willing to accept the patient.  Yes   Patient/family informed of [facility]'s ownership interest in [facility] and [facility], as well as of the fact that they are under no obligation to receive care at these facilities.  PASRR submitted to EDS on 10/24/16     PASRR number received on 10/24/16     Existing PASRR number confirmed on       FL2 transmitted to all facilities in geographic area requested by pt/family on 10/24/16     FL2 transmitted to all facilities within larger geographic area on       Patient informed that his/her managed care company has contracts with or will negotiate with certain facilities, including the following:        Yes   Patient/family informed of bed offers received.  [facility]     Physician recommends and patient chooses bed at      Patient to be transferred to [facility] on 10/26/16.  Patient to be transferred to facility [facility]     Patient family notified on 10/26/16 of transfer.  Name of family member notified:  Gordy SaversKiley Moore DSS legal guardain [phone]     PHYSICIAN Please sign  FL2     Additional Comment:    _______________________________________________ Darleene CleaverAnterhaus, Masayo Fera R, LCSWA 10/26/2016, 12:29 PM

## 2016-10-26 NOTE — Discharge Summary (Signed)
[facility] [place] [facility]   PATIENT NAME: Tina Ellison    MR#:  [id number]  DATE OF BIRTH:  1945-11-16  DATE OF ADMISSION:  10/21/2016   ADMITTING PHYSICIAN: Ramonita LabAruna Gouru, MD  DATE OF DISCHARGE: 10/26/2016  PRIMARY CARE PHYSICIAN: Duffy RhodyPartridge, Tanillya, FNP   ADMISSION DIAGNOSIS:  Hydronephrosis [N13.30] Calf tenderness [M79.669] Urinary tract infection with hematuria, site unspecified [N39.0, R31.9] Altered mental status, unspecified altered mental status type [R41.82] Altered mental status [R41.82] DISCHARGE DIAGNOSIS:  Active Problems:   Altered mental status  SECONDARY DIAGNOSIS:   Past Medical History:  Diagnosis Date  . Anxiety   . Anxiety disorder   . COPD (chronic obstructive pulmonary disease) (HCC)   . Depression   . GERD (gastroesophageal reflux disease)   . Hypothyroidism   . Memory loss   . Osteoporosis   . Overactive bladder   . Schizoaffective disorder (HCC)   . Schizophrenia (HCC)   . Thrombocytopenia (HCC)   . Urinary retention   . UTI (lower urinary tract infection)   . Vitamin D deficiency   . Vitamin deficiency   . Weakness    HOSPITAL COURSE:  Tina Ellison a 71 y.o. femalewith a known history of COPD, GERD, hypothyroidism, schizoaffective disorder, schizophrenia and multiple other medical problems is admitted for altered mental status According to the EMS report patient fell on 24th of August and was seen at [facility], at that time CT head was negative and was discharged on August 27 but her mental status is deteriorating and falling asleep a lot.  # Altered mental status secondary to sepsis -Present on admission - CT head is negative -Back to her baseline mental status now  #Sepsis secondary to UTI -pneumonia ruled out, normal pro-calcitonin -Stopped all antibiotics  #elevated d-dimer from the underlying sepsis CT angiogram is negative for pulmonary embolism Patient has calf tenderness-Bilateral  lower Extremity venous Dopplers neg for DVT  #Bilateral mild hydronephrosis: Likely due to urinary obstruction -Left-sided hydronephrosis has resolved status post Foley - Right hydronephrosis persists, Also Right uretereal filling defect -continue foley on discharge as per urology - outpatient trial of void - CT Urogram in 3-4 weeks to further evaluate filling defect along with outpatient cysto in 4 weeks after CT with Urology  #Subacute T1 and T2 compression fracture -Appreciate orthopedics/neurosurgery input -CTO brace & conservative management, pain control  DISCHARGE CONDITIONS:  stable CONSULTS OBTAINED:  Treatment Team:  Ramonita LabGouru, Aruna, MD Garnette Gunnerurrani, Shakeel, MD DRUG ALLERGIES:   Allergies  Allergen Reactions  . Haldol [Haloperidol]     Patient reported-states dry mouth. No allergies are listed on MAR   DISCHARGE MEDICATIONS:   Allergies as of 10/26/2016      Reactions   Haldol [haloperidol]    Patient reported-states dry mouth. No allergies are listed on MAR      Medication List    TAKE these medications   alendronate 70 MG tablet Commonly known as:  FOSAMAX Take 70 mg by mouth once a week. Take with a full glass of water on an empty stomach. Every Monday.   atorvastatin 10 MG tablet Commonly known as:  LIPITOR Take 10 mg by mouth every evening.   benztropine 1 MG tablet Commonly known as:  COGENTIN Take 1 mg by mouth 2 (two) times daily.   cholecalciferol 1000 units tablet Commonly known as:  VITAMIN D Take 1,000 Units by mouth daily.   divalproex 500 MG 24 hr tablet Commonly known as:  DEPAKOTE ER Take  500 mg by mouth 2 (two) times daily.   docusate sodium 100 MG capsule Commonly known as:  COLACE Take 100 mg by mouth 2 (two) times daily.   folic acid 400 MCG tablet Commonly known as:  FOLVITE Take 400 mcg by mouth daily.   haloperidol 10 MG tablet Commonly known as:  HALDOL Take 10 mg by mouth 2 (two) times daily.   levothyroxine 50 MCG  tablet Commonly known as:  SYNTHROID, LEVOTHROID Take 50 mcg by mouth daily before breakfast.   Melatonin 3 MG Tabs Take 3 mg by mouth at bedtime.   multivitamin with minerals Tabs tablet Take 1 tablet by mouth daily.   OLANZapine 20 MG tablet Commonly known as:  ZYPREXA Take 20 mg by mouth daily.   omeprazole 20 MG capsule Commonly known as:  PRILOSEC Take 20 mg by mouth daily.   oxybutynin 5 MG tablet Commonly known as:  DITROPAN Take 5 mg by mouth daily.   traZODone 50 MG tablet Commonly known as:  DESYREL Take 25-50 mg by mouth 2 (two) times daily. *Take one tablet daily in the morning and one-half tablet daily at bedtime            Discharge Care Instructions        Start     Ordered   10/26/16 0000  Increase activity slowly     10/26/16 0958   10/26/16 0000  Diet - low sodium heart healthy     10/26/16 0958   10/26/16 0000  Discharge instructions    Comments:  Leave CTO brace in place - can have PT/OT  Leave foley indwelling until f/up by Urology office   10/26/16 0958       DISCHARGE INSTRUCTIONS:   DIET:  Regular diet DISCHARGE CONDITION:  Good ACTIVITY:  Activity as tolerated OXYGEN:  Home Oxygen: No.  Oxygen Delivery: room air DISCHARGE LOCATION:  nursing home   If you experience worsening of your admission symptoms, develop shortness of breath, life threatening emergency, suicidal or homicidal thoughts you must seek medical attention immediately by calling 911 or calling your MD immediately  if symptoms less severe.  You Must read complete instructions/literature along with all the possible adverse reactions/side effects for all the Medicines you take and that have been prescribed to you. Take any new Medicines after you have completely understood and accpet all the possible adverse reactions/side effects.   Please note  You were cared for by a hospitalist during your hospital stay. If you have any questions about your discharge  medications or the care you received while you were in the hospital after you are discharged, you can call the unit and asked to speak with the hospitalist on call if the hospitalist that took care of you is not available. Once you are discharged, your primary care physician will handle any further medical issues. Please note that NO REFILLS for any discharge medications will be authorized once you are discharged, as it is imperative that you return to your primary care physician (or establish a relationship with a primary care physician if you do not have one) for your aftercare needs so that they can reassess your need for medications and monitor your lab values.    On the day of Discharge:  VITAL SIGNS:  Blood pressure 110/62, pulse 86, temperature 97.9 F (36.6 C), temperature source Oral, resp. rate 20, weight 60.1 kg (132 lb 8 oz), SpO2 92 %. PHYSICAL EXAMINATION:  GENERAL:  71 y.o.-year-old patient lying  in the bed with no acute distress.  EYES: Pupils equal, round, reactive to light and accommodation. No scleral icterus. Extraocular muscles intact.  HEENT: Head atraumatic, normocephalic. Oropharynx and nasopharynx clear.  NECK:  Supple, no jugular venous distention. No thyroid enlargement, no tenderness.  LUNGS: Normal breath sounds bilaterally, no wheezing, rales,rhonchi or crepitation. No use of accessory muscles of respiration.  CARDIOVASCULAR: S1, S2 normal. No murmurs, rubs, or gallops.  ABDOMEN: Soft, non-tender, non-distended. Bowel sounds present. No organomegaly or mass.  EXTREMITIES: No pedal edema, cyanosis, or clubbing.  NEUROLOGIC: Cranial nerves II through XII are intact. Muscle strength 5/5 in all extremities. Sensation intact. Gait not checked.  PSYCHIATRIC: The patient is alert and oriented x 3.  SKIN: No obvious rash, lesion, or ulcer.  DATA REVIEW:   CBC  Recent Labs Lab 10/23/16 0458  WBC 10.8  HGB 11.9*  HCT 35.1  PLT 269    Chemistries   Recent  Labs Lab 10/22/16 0336 10/23/16 0458  NA 138 136  K 3.6 4.0  CL 103 104  CO2 28 26  GLUCOSE 98 114*  BUN 16 14  CREATININE 0.58 0.61  CALCIUM 7.8* 8.0*  AST 13*  --   ALT 10*  --   ALKPHOS 73  --   BILITOT 0.6  --      Microbiology Results  Results for orders placed or performed during the hospital encounter of 10/21/16  Urine culture     Status: Abnormal   Collection Time: 10/21/16  8:21 PM  Result Value Ref Range Status   Specimen Description URINE, RANDOM  Final   Special Requests NONE  Final   Culture (A)  Final    >=100,000 COLONIES/mL AEROCOCCUS URINAE Standardized susceptibility testing for this organism is not available. Performed at [facility], [address] [place], [place] [address]    Report Status 10/24/2016 FINAL  Final     Contact information for follow-up providers    Duffy Rhody, FNP. Schedule an appointment as soon as possible for a visit in 1 week(s).   Specialty:  Family Medicine Contact information: [address] [place] [address] [phone]        Keith Rake, MD. Schedule an appointment as soon as possible for a visit in 3 week(s).   Specialty:  Neurosurgery Contact information: [address] [place] [address] [phone]        Vanna Scotland, MD. Schedule an appointment as soon as possible for a visit in 1 week(s).   Specialty:  Urology Contact information: [address] [place] [address] [phone]            Contact information for after-discharge care    Destination    [facility] Follow up.   Specialty:  Skilled Nursing Facility Contact information: [address] [place] [address] [phone]                 Management plans discussed with the patient, family and they are in agreement.  CODE STATUS: Full Code   TOTAL TIME TAKING CARE OF THIS PATIENT: 45 minutes.    Delfino Lovett M.D  on 10/26/2016 at 10:00 AM  Between 7am to 6pm - Pager - [phone]  After 6pm go to [email or web address] - Social research officer, government  [facility]  Office  [phone]  CC: Primary care physician; Duffy Rhody, FNP   Note: This dictation was prepared with Dragon dictation along  with smaller phrase technology. Any transcriptional errors that result from this process are unintentional.

## 2016-10-26 NOTE — Clinical Social Work Note (Signed)
Patient will be going to [facility] [address] [facility] [facility]. Patient to be d/c'ed today to [facility] [place].  Patient and guardian agreeable to plans will transport via ems RN to call report to Tina ModenaKathy Ellison [phone], room 501.  Tina MouldingEric Aerial Ellison, MSW, Theresia MajorsLCSWA [phone]

## 2016-10-31 ENCOUNTER — Ambulatory Visit (INDEPENDENT_AMBULATORY_CARE_PROVIDER_SITE_OTHER): Payer: Medicare Other | Admitting: Family Medicine

## 2016-10-31 VITALS — BP 91/59 | HR 85 | Ht 68.0 in | Wt 140.0 lb

## 2016-10-31 DIAGNOSIS — R339 Retention of urine, unspecified: Secondary | ICD-10-CM

## 2016-10-31 LAB — BLADDER SCAN AMB NON-IMAGING: Scan Result: 110

## 2016-10-31 NOTE — Progress Notes (Signed)
Catheter Removal  Patient is present today for a catheter removal.  10ml of water was drained from the balloon. A 14FR foley cath was removed from the bladder no complications were noted . Patient tolerated well.  Preformed by: Teressa Lowerarrie Taunia Frasco, CMA  Follow up/ Additional notes: Patient is to return to office in afternoon for a bladder scan to ensure bladder emptying.   PVR was 110.  She will follow up for Cysto sfter CT hematuria study is done.

## 2016-11-01 ENCOUNTER — Ambulatory Visit: Payer: Medicare Other | Admitting: Podiatry

## 2016-11-08 ENCOUNTER — Encounter: Payer: Self-pay | Admitting: Podiatry

## 2016-11-08 ENCOUNTER — Ambulatory Visit (INDEPENDENT_AMBULATORY_CARE_PROVIDER_SITE_OTHER): Payer: Medicare Other | Admitting: Podiatry

## 2016-11-08 VITALS — BP 108/75 | HR 95

## 2016-11-08 DIAGNOSIS — M79675 Pain in left toe(s): Secondary | ICD-10-CM

## 2016-11-08 DIAGNOSIS — B351 Tinea unguium: Secondary | ICD-10-CM

## 2016-11-08 DIAGNOSIS — M79674 Pain in right toe(s): Secondary | ICD-10-CM

## 2016-11-08 NOTE — Progress Notes (Signed)
   Subjective:    Patient ID: Tina Ellison, female    DOB: 1945-07-17, 71 y.o.   MRN: [id number]  HPI this patient presents the office with chief complaint of long thick painful nails.  Patient states the nails are painful walking and wearing her shoes.  Patient is unable to self treat.  Patient has not had her nails done and cannot remember the last time. They were done  this patient is diabetic.  She presents the office today for an evaluation and treatment of her long painful nails.     Review of Systems  All other systems reviewed and are negative.      Objective:   Physical Exam GENERAL APPEARANCE: Alert, conversant. Appropriately groomed. No acute distress.  VASCULAR: Pedal pulses are  palpable at  DP  bilateral. PT pulses are absent. Capillary refill time is immediate to all digits,  Normal temperature gradient.   NEUROLOGIC: sensation is normal to 5.07 monofilament at 5/5 sites bilateral.  Light touch is intact bilateral, Muscle strength normal.  MUSCULOSKELETAL: acceptable muscle strength, tone and stability bilateral.  Intrinsic muscluature intact bilateral.  Rectus appearance of foot and digits noted bilateral.  NAILS  Thick disfigured discolored nails both feet. DERMATOLOGIC: skin color, texture, and turgor are within normal limits.  No preulcerative lesions or ulcers  are seen, no interdigital maceration noted.  No open lesions present.   No drainage noted.         Assessment & Plan:  Onychomycosis  B/L  Diabetes  IE  Debride nails  B/L.  RTC 3 weeks.   Helane Gunther DPM

## 2016-11-13 ENCOUNTER — Ambulatory Visit
Admission: RE | Admit: 2016-11-13 | Discharge: 2016-11-13 | Disposition: A | Discharge status: Home / Self Care | Payer: Medicare Other | Source: Ambulatory Visit | Attending: Urology | Admitting: Urology

## 2016-11-13 DIAGNOSIS — I700 Atherosclerosis of aorta: Secondary | ICD-10-CM | POA: Diagnosis not present

## 2016-11-13 DIAGNOSIS — R9341 Abnormal radiologic findings on diagnostic imaging of renal pelvis, ureter, or bladder: Secondary | ICD-10-CM | POA: Insufficient documentation

## 2016-11-13 DIAGNOSIS — N3289 Other specified disorders of bladder: Secondary | ICD-10-CM | POA: Insufficient documentation

## 2016-11-13 DIAGNOSIS — M47816 Spondylosis without myelopathy or radiculopathy, lumbar region: Secondary | ICD-10-CM | POA: Insufficient documentation

## 2016-11-13 DIAGNOSIS — Z96643 Presence of artificial hip joint, bilateral: Secondary | ICD-10-CM | POA: Diagnosis not present

## 2016-11-13 DIAGNOSIS — I2510 Atherosclerotic heart disease of native coronary artery without angina pectoris: Secondary | ICD-10-CM | POA: Insufficient documentation

## 2016-11-13 MED ORDER — IOPAMIDOL (ISOVUE-300) INJECTION 61%
125.0000 mL | Freq: Once | INTRAVENOUS | Status: AC | PRN
  Administered 2016-11-13: 125 mL via INTRAVENOUS

## 2016-11-29 ENCOUNTER — Other Ambulatory Visit: Payer: Self-pay | Admitting: Urology

## 2016-11-29 ENCOUNTER — Encounter: Payer: Self-pay | Admitting: Urology

## 2017-02-07 ENCOUNTER — Ambulatory Visit: Payer: Medicare Other | Admitting: Podiatry

## 2017-03-26 ENCOUNTER — Emergency Department (HOSPITAL_COMMUNITY)
Admission: EM | Admit: 2017-03-26 | Discharge: 2017-03-26 | Disposition: A | Discharge status: Home / Self Care | Payer: Medicare Other | Attending: Emergency Medicine | Admitting: Emergency Medicine

## 2017-03-26 ENCOUNTER — Emergency Department (HOSPITAL_COMMUNITY): Payer: Medicare Other

## 2017-03-26 ENCOUNTER — Encounter (HOSPITAL_COMMUNITY): Payer: Self-pay | Admitting: Emergency Medicine

## 2017-03-26 ENCOUNTER — Other Ambulatory Visit: Payer: Self-pay

## 2017-03-26 DIAGNOSIS — E119 Type 2 diabetes mellitus without complications: Secondary | ICD-10-CM | POA: Insufficient documentation

## 2017-03-26 DIAGNOSIS — N3090 Cystitis, unspecified without hematuria: Secondary | ICD-10-CM | POA: Diagnosis not present

## 2017-03-26 DIAGNOSIS — E039 Hypothyroidism, unspecified: Secondary | ICD-10-CM | POA: Diagnosis not present

## 2017-03-26 DIAGNOSIS — F17210 Nicotine dependence, cigarettes, uncomplicated: Secondary | ICD-10-CM | POA: Diagnosis not present

## 2017-03-26 DIAGNOSIS — Z79899 Other long term (current) drug therapy: Secondary | ICD-10-CM | POA: Insufficient documentation

## 2017-03-26 DIAGNOSIS — R4182 Altered mental status, unspecified: Secondary | ICD-10-CM | POA: Diagnosis present

## 2017-03-26 DIAGNOSIS — J449 Chronic obstructive pulmonary disease, unspecified: Secondary | ICD-10-CM | POA: Insufficient documentation

## 2017-03-26 LAB — CBC WITH DIFFERENTIAL/PLATELET
BASOS ABS: 0 10*3/uL (ref 0.0–0.1)
Basophils Relative: 0 %
Eosinophils Absolute: 0.1 10*3/uL (ref 0.0–0.7)
Eosinophils Relative: 1 %
HEMATOCRIT: 39.9 % (ref 36.0–46.0)
Hemoglobin: 12.7 g/dL (ref 12.0–15.0)
LYMPHS PCT: 34 %
Lymphs Abs: 2.2 10*3/uL (ref 0.7–4.0)
MCH: 28.3 pg (ref 26.0–34.0)
MCHC: 31.8 g/dL (ref 30.0–36.0)
MCV: 89.1 fL (ref 78.0–100.0)
MONO ABS: 0.9 10*3/uL (ref 0.1–1.0)
MONOS PCT: 14 %
NEUTROS ABS: 3.2 10*3/uL (ref 1.7–7.7)
NEUTROS PCT: 51 %
Platelets: 149 10*3/uL — ABNORMAL LOW (ref 150–400)
RBC: 4.48 MIL/uL (ref 3.87–5.11)
RDW: 14.4 % (ref 11.5–15.5)
WBC: 6.3 10*3/uL (ref 4.0–10.5)

## 2017-03-26 LAB — URINALYSIS, ROUTINE W REFLEX MICROSCOPIC
Bilirubin Urine: NEGATIVE
GLUCOSE, UA: NEGATIVE mg/dL
HGB URINE DIPSTICK: NEGATIVE
KETONES UR: 5 mg/dL — AB
NITRITE: POSITIVE — AB
PROTEIN: 30 mg/dL — AB
Specific Gravity, Urine: 1.02 (ref 1.005–1.030)
pH: 6 (ref 5.0–8.0)

## 2017-03-26 LAB — COMPREHENSIVE METABOLIC PANEL
ALT: 6 U/L — ABNORMAL LOW (ref 14–54)
ANION GAP: 11 (ref 5–15)
AST: 17 U/L (ref 15–41)
Albumin: 3 g/dL — ABNORMAL LOW (ref 3.5–5.0)
Alkaline Phosphatase: 61 U/L (ref 38–126)
BUN: 21 mg/dL — AB (ref 6–20)
CHLORIDE: 101 mmol/L (ref 101–111)
CO2: 28 mmol/L (ref 22–32)
Calcium: 8.9 mg/dL (ref 8.9–10.3)
Creatinine, Ser: 0.66 mg/dL (ref 0.44–1.00)
GFR calc Af Amer: >60 mL/min (ref >60)
GFR calc non Af Amer: >60 mL/min (ref >60)
Glucose, Bld: 145 mg/dL — ABNORMAL HIGH (ref 65–99)
POTASSIUM: 3.8 mmol/L (ref 3.5–5.1)
SODIUM: 140 mmol/L (ref 135–145)
Total Bilirubin: 0.3 mg/dL (ref 0.3–1.2)
Total Protein: 6.1 g/dL — ABNORMAL LOW (ref 6.5–8.1)

## 2017-03-26 LAB — TROPONIN I: Troponin I: <0.03 ng/mL (ref <0.03)

## 2017-03-26 MED ORDER — SODIUM CHLORIDE 0.9 % IV BOLUS (SEPSIS)
1000.0000 mL | Freq: Once | INTRAVENOUS | Status: AC
  Administered 2017-03-26: 1000 mL via INTRAVENOUS

## 2017-03-26 MED ORDER — DEXTROSE 5 % IV SOLN
1.0000 g | Freq: Once | INTRAVENOUS | Status: AC
  Administered 2017-03-26: 1 g via INTRAVENOUS
  Filled 2017-03-26: qty 10

## 2017-03-26 MED ORDER — CEPHALEXIN 500 MG PO CAPS: 500.0000 mg | ORAL_CAPSULE | Freq: Four times a day (QID) | ORAL | 0 refills | Status: AC

## 2017-03-26 NOTE — ED Triage Notes (Signed)
Pt from [facility], [phone], sent for increased weakness for unknown amount of time and possible UTI. Per staff pt has had increased weakness since returning from rehab several weeks ago. Pt denies dysuria.

## 2017-03-26 NOTE — ED Provider Notes (Signed)
[facility] EMERGENCY DEPARTMENT Provider Note   CSN: [id number] Arrival date & time: 03/26/17  1411     History   Chief Complaint Chief Complaint  Patient presents with  . Weakness    HPI Tina Ellison is a 72 y.o. female.  Pt presents to the ED today from [facility] with altered mental status.  This has been ongoing for unkn time.  The staff told EMS that pt has been weak since returning from rehab several weeks ago.  Pt is a poor historian and is unable to give any significant hx.  Pt denies any pain other than to the right side of her head from a fall.      Past Medical History:  Diagnosis Date  . Anxiety   . Anxiety disorder   . COPD (chronic obstructive pulmonary disease) (HCC)   . Depression   . GERD (gastroesophageal reflux disease)   . Hypothyroidism   . Memory loss   . Osteoporosis   . Overactive bladder   . Schizoaffective disorder (HCC)   . Schizophrenia (HCC)   . Thrombocytopenia (HCC)   . Urinary retention   . UTI (lower urinary tract infection)   . Vitamin D deficiency   . Vitamin deficiency   . Weakness     Patient Active Problem List   Diagnosis Date Noted  . Altered mental status 10/21/2016  . UTI (urinary tract infection) 06/21/2016  . Disorganized schizophrenia (HCC)   . Schizophrenia (HCC) 07/07/2014  . Diabetes (HCC) 07/07/2014    Past Surgical History:  Procedure Laterality Date  . BREAST SURGERY Right     OB History    No data available       Home Medications    Prior to Admission medications   Medication Sig Start Date End Date Taking? Authorizing Provider  acetaminophen (MAPAP) 325 MG tablet Take 650 mg by mouth every 6 (six) hours as needed for mild pain or moderate pain.   Yes [provider]  alendronate (FOSAMAX) 70 MG tablet Take 70 mg by mouth once a week. Take with a full glass of water on an empty stomach. Every Monday.   Yes [provider]  Amino Acids-Protein Hydrolys  (FEEDING SUPPLEMENT, PRO-STAT SUGAR FREE 64,) LIQD Take 30 mLs by mouth 3 (three) times daily with meals.   Yes [provider]  atorvastatin (LIPITOR) 10 MG tablet Take 10 mg by mouth every evening.   Yes [provider]  cholecalciferol (VITAMIN D) 1000 units tablet Take 1,000 Units by mouth daily.   Yes [provider]  divalproex (DEPAKOTE ER) 500 MG 24 hr tablet Take 500 mg by mouth 2 (two) times daily.    Yes [provider]  docusate sodium (COLACE) 100 MG capsule Take 100 mg by mouth 2 (two) times daily.   Yes [provider]  folic acid (FOLVITE) 400 MCG tablet Take 400 mcg by mouth daily.   Yes [provider]  levothyroxine (SYNTHROID, LEVOTHROID) 50 MCG tablet Take 50 mcg by mouth daily before breakfast.   Yes [provider]  LORazepam (ATIVAN) 0.5 MG tablet Take 0.5 mg by mouth 3 (three) times daily.   Yes [provider]  Melatonin 3 MG TABS Take 3 mg by mouth at bedtime.   Yes [provider]  Multiple Vitamin (MULTIVITAMIN WITH MINERALS) TABS tablet Take 1 tablet by mouth daily.   Yes [provider]  OLANZapine (ZYPREXA) 10 MG tablet Take 20 mg  by mouth 2 (two) times daily.    Yes [provider]  omeprazole (PRILOSEC) 20 MG capsule Take 20 mg by mouth daily.   Yes [provider]  oxybutynin (DITROPAN) 5 MG tablet Take 5 mg by mouth daily.   Yes [provider]  vitamin C (ASCORBIC ACID) 500 MG tablet Take 500 mg by mouth 2 (two) times daily.   Yes [provider]  cephALEXin (KEFLEX) 500 MG capsule Take 1 capsule (500 mg total) by mouth 4 (four) times daily. 03/26/17   Jacalyn Lefevre, MD    Family History History reviewed. No pertinent family history.  Social History Social History   Tobacco Use  . Smoking status: Current Every Day Smoker    Packs/day: 0.50    Types: Cigarettes  . Smokeless tobacco: Never Used  Substance Use Topics  . Alcohol  use: No  . Drug use: No     Allergies   Haldol [haloperidol]; Haloperidol lactate; and Olanzapine   Review of Systems Review of Systems  Neurological: Positive for headaches.  All other systems reviewed and are negative.    Physical Exam Updated Vital Signs BP (!) 117/94   Pulse 93   Temp (!) 97.5 F (36.4 C) (Oral)   Resp (!) 28   Wt 65.8 kg (145 lb)   SpO2 94%   BMI 22.05 kg/m   Physical Exam  Constitutional: She appears well-developed and well-nourished.  HENT:  Head: Normocephalic and atraumatic.  Right Ear: External ear normal.  Left Ear: External ear normal.  Nose: Nose normal.  Mouth/Throat: Oropharynx is clear and moist.  Eyes: Conjunctivae and EOM are normal. Pupils are equal, round, and reactive to light.  Neck: Normal range of motion. Neck supple.  Cardiovascular: Normal rate, regular rhythm, normal heart sounds and intact distal pulses.  Pulmonary/Chest: Effort normal and breath sounds normal.  Abdominal: Soft. Bowel sounds are normal.  Musculoskeletal: Normal range of motion.  Neurological: She is alert.  Unkn what pt's baseline is.  She is not oriented and said that [place] is not her name.  She is moving all 4 extremities and is following commands.  Skin: Skin is warm and dry. Capillary refill takes less than 2 seconds.  Psychiatric: She has a normal mood and affect.  Nursing note and vitals reviewed.    ED Treatments / Results  Labs (all labs ordered are listed, but only abnormal results are displayed) Labs Reviewed  COMPREHENSIVE METABOLIC PANEL - Abnormal; Notable for the following components:      Result Value   Glucose, Bld 145 (*)    BUN 21 (*)    Total Protein 6.1 (*)    Albumin 3.0 (*)    ALT 6 (*)    All other components within normal limits  CBC WITH DIFFERENTIAL/PLATELET - Abnormal; Notable for the following components:   Platelets 149 (*)    All other components within normal limits  URINALYSIS, ROUTINE W REFLEX MICROSCOPIC -  Abnormal; Notable for the following components:   Color, Urine AMBER (*)    APPearance CLOUDY (*)    Ketones, ur 5 (*)    Protein, ur 30 (*)    Nitrite POSITIVE (*)    Leukocytes, UA MODERATE (*)    Bacteria, UA FEW (*)    Squamous Epithelial / LPF 0-5 (*)    All other components within normal limits  URINE CULTURE  TROPONIN I    EKG  EKG Interpretation  Date/Time:  Tuesday March 26 2017 14:59:29  EST Ventricular Rate:  91 PR Interval:    QRS Duration: 104 QT Interval:  427 QTC Calculation: 526 R Axis:   65 Text Interpretation:  Sinus rhythm Short PR interval Low voltage, precordial leads Borderline repolarization abnormality Prolonged QT interval Poor data quality No significant change since last tracing Confirmed by Jacalyn Lefevre [phone]) on 03/26/2017 3:01:51 PM       Radiology Dg Chest 2 View  Result Date: 03/26/2017 CLINICAL DATA:  Cough and increased weakness. Possible urinary tract infection. History of schizophrenia, diabetes, current smoker. EXAM: CHEST  2 VIEW COMPARISON:  AP chest x-ray of October 21, 2016 FINDINGS: The lungs are borderline hypoinflated. There is no infiltrate or atelectasis. The heart and pulmonary vascularity are normal. The mediastinum is normal in width. The trachea is midline. The bony thorax exhibits no acute abnormality. There are surgical clips in the right hilar region. IMPRESSION: Borderline hypoinflation. There is no pneumonia, CHF, nor other acute cardiopulmonary abnormality. Electronically Signed   By: David  [place] M.D.   On: 03/26/2017 15:53   Ct Head Wo Contrast  Result Date: 03/26/2017 CLINICAL DATA:  Increased weakness, possibly UTI, history schizophrenia, COPD, smoker EXAM: CT HEAD WITHOUT CONTRAST TECHNIQUE: Contiguous axial images were obtained from the base of the skull through the vertex without intravenous contrast. Sagittal and coronal MPR images reconstructed from axial data set. COMPARISON:  10/21/2016 FINDINGS: Brain: Mild  atrophy. Diffuse prominence of ventricular system out of proportion to the degree of sulcal enlargement unchanged from the previous exam, question normal pressure hydrocephalus. No midline shift or mass effect. Mild small vessel chronic ischemic changes of deep cerebral white matter. No intracranial hemorrhage, mass lesion, or evidence of acute infarction. No extra-axial fluid collections. Vascular: Unremarkable Skull: Intact Sinuses/Orbits: Opacified RIGHT maxillary sinus. Remaining visualized paranasal sinuses and mastoid air cells clear. Other: N/A IMPRESSION: Atrophy with small vessel chronic ischemic changes of deep cerebral white matter. Question normal pressure hydrocephalus. No acute intracranial abnormalities. New RIGHT sphenoid sinus disease. Electronically Signed   By: Ulyses Southward M.D.   On: 03/26/2017 16:01    Procedures Procedures (including critical care time)  Medications Ordered in ED Medications  sodium chloride 0.9 % bolus 1,000 mL (0 mLs Intravenous Stopped 03/26/17 1703)  cefTRIAXone (ROCEPHIN) 1 g in dextrose 5 % 50 mL IVPB (1 g Intravenous New Bag/Given 03/26/17 1703)     Initial Impression / Assessment and Plan / ED Course  I have reviewed the triage vital signs and the nursing notes.  Pertinent labs & imaging results that were available during my care of the patient were reviewed by me and considered in my medical decision making (see chart for details).    Pt is awake and alert.  No signs of sepsis.  I suspect disorientation is chronic.  Culture pending.   Pt has a legal guardian according to chart, but I don't know who it is or how to contact them, so I did not call.  Pt is stable for d/c.  Final Clinical Impressions(s) / ED Diagnoses   Final diagnoses:  Cystitis    ED Discharge Orders        Ordered    cephALEXin (KEFLEX) 500 MG capsule  4 times daily     03/26/17 1745       Jacalyn Lefevre, MD 03/26/17 1747

## 2017-03-29 LAB — URINE CULTURE: Culture: >=100000 — AB

## 2017-03-30 ENCOUNTER — Telehealth: Payer: Self-pay

## 2017-03-30 NOTE — Telephone Encounter (Signed)
Pt needs change in abx to Bactrim DS per [facility]. [facility] with [facility] giver at [facility] [phone].  Faxed new medication to [facility]: [phone]

## 2017-03-31 NOTE — Progress Notes (Signed)
ED Antimicrobial Stewardship Positive Culture Follow Up   Tina Ellison is an 72 y.o. female who presented to [facility]26/2017 with a chief complaint of AMS and weakness Chief Complaint  Patient presents with  . Weakness    Recent Results (from the past 720 hour(s))  Urine culture     Status: Abnormal   Collection Time: 03/26/17  4:56 PM  Result Value Ref Range Status   Specimen Description   Final    URINE, RANDOM Performed at [facility], [address] [place], [place] [address]    Special Requests   Final    NONE Performed at [facility], [address] [place], [place] [address]    Culture (A)  Final    >=100,000 COLONIES/mL STAPHYLOCOCCUS SPECIES (COAGULASE NEGATIVE)   Report Status 03/29/2017 FINAL  Final   Organism ID, Bacteria STAPHYLOCOCCUS SPECIES (COAGULASE NEGATIVE) (A)  Final      Susceptibility   Staphylococcus species (coagulase negative) - MIC*    CIPROFLOXACIN >=8 RESISTANT Resistant     GENTAMICIN <=0.5 SENSITIVE Sensitive     NITROFURANTOIN <=16 SENSITIVE Sensitive     OXACILLIN 1 RESISTANT Resistant     TETRACYCLINE <=1 SENSITIVE Sensitive     VANCOMYCIN <=0.5 SENSITIVE Sensitive     TRIMETH/SULFA <=10 SENSITIVE Sensitive     CLINDAMYCIN RESISTANT Resistant     RIFAMPIN <=0.5 SENSITIVE Sensitive     Inducible Clindamycin POSITIVE Resistant     * >=100,000 COLONIES/mL STAPHYLOCOCCUS SPECIES (COAGULASE NEGATIVE)    [x]  Treated with cephalexin, organism resistant to prescribed antimicrobial []  Patient discharged originally without antimicrobial agent and treatment is now indicated  New antibiotic prescription: bactrim DS BID x7 days  ED Provider: Brooke DareHope Neese  Terrin Meddaugh L Ellary Casamento 03/31/2017, 5:03 PM

## 2017-10-16 IMAGING — CT CT ABD-PEL WO/W CM
3 of 12 series · 10 of 46 positions shown, 16 images · IV contrast (APPLIED)
Comparison: 10/21/2016 unenhanced CT abdomen/pelvis.

CLINICAL DATA: Follow-up right urinary tract filling defects on
recent CT abdomen/ pelvis study.

EXAM:
CT ABDOMEN AND PELVIS WITHOUT AND WITH CONTRAST
TECHNIQUE: Multidetector CT imaging of the abdomen and pelvis was performed
following the standard protocol before and following the bolus
administration of intravenous contrast.
CONTRAST:  125mL KEX5YC-AKK IOPAMIDOL (KEX5YC-AKK) INJECTION 61%

[Series 5: coronal pre · coronal · non-contrast · 0.77mm/px · 2 of 73 slices shown, 3 images]
[im 25/73  soft-tissue]
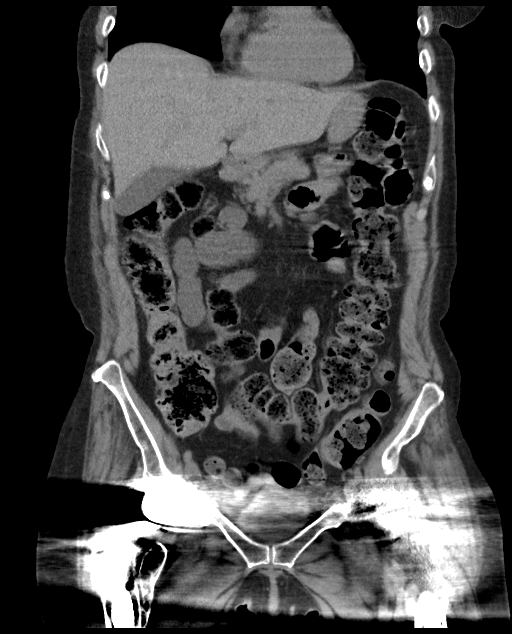
[im 25/73  bone]
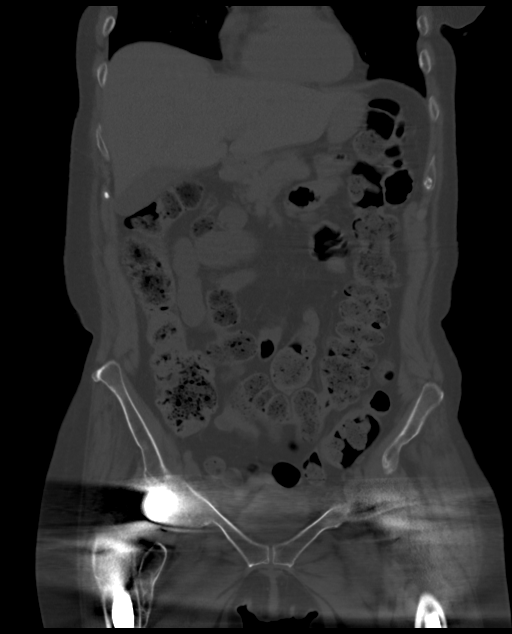
[im 49/73  soft-tissue]
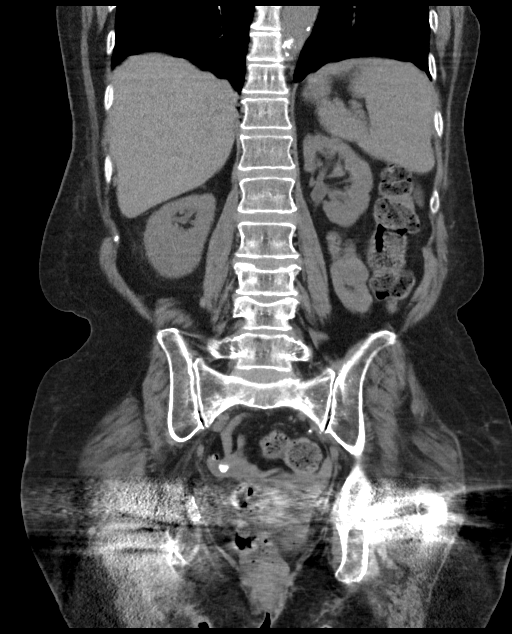

[Series 7: axial post · axial · 0.72mm/px · z∈[-407,-87]mm · 5 of 97 slices shown, 10 images]
[im 17/97  soft-tissue]
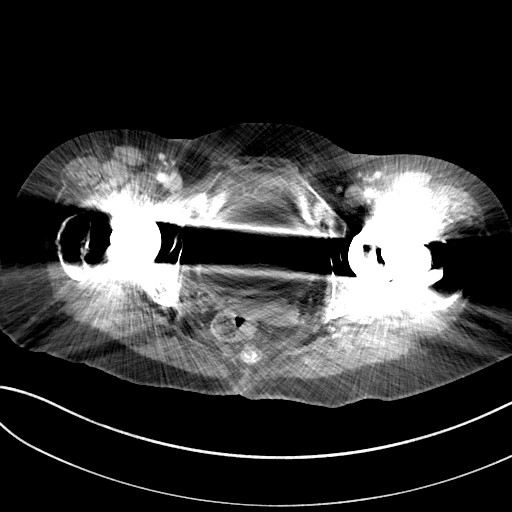
[im 17/97  bone]
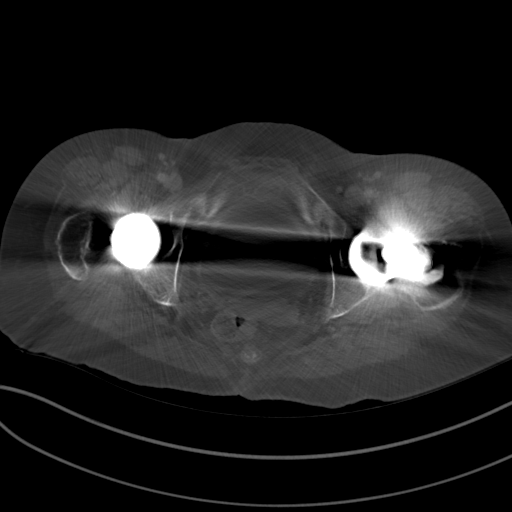
[im 33/97  soft-tissue]
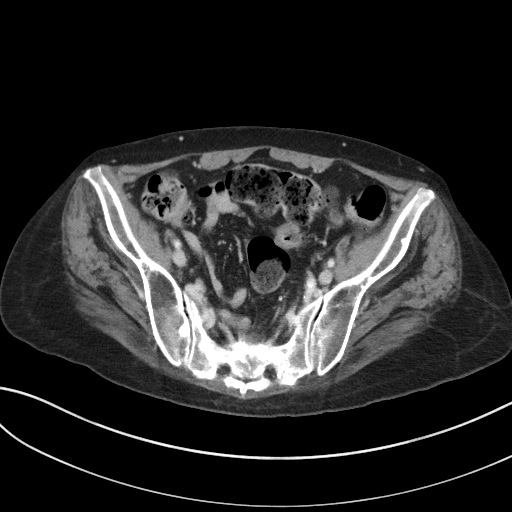
[im 33/97  lung]
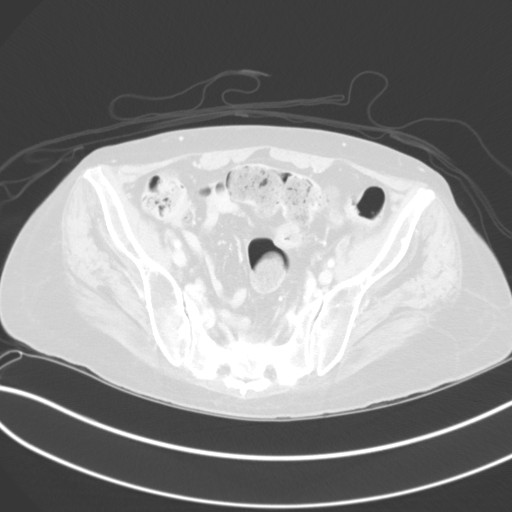
[im 49/97  soft-tissue]
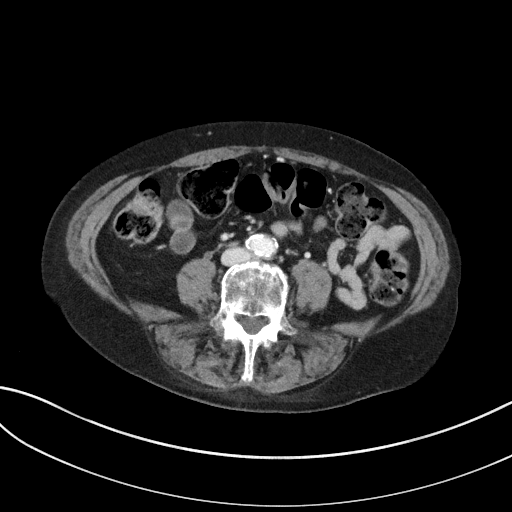
[im 49/97  lung]
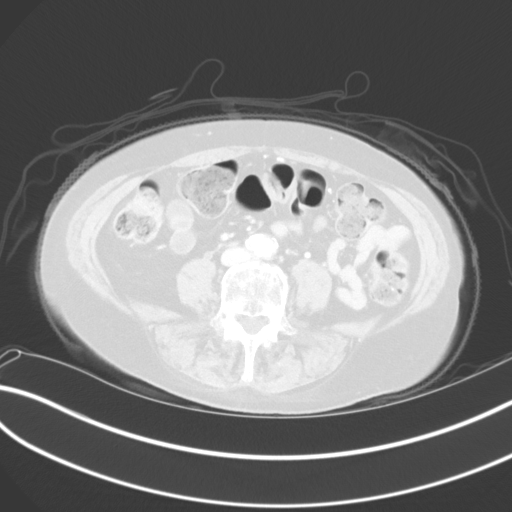
[im 65/97  soft-tissue]
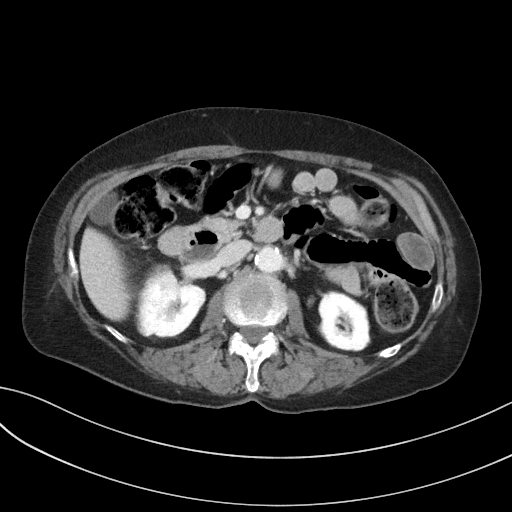
[im 65/97  lung]
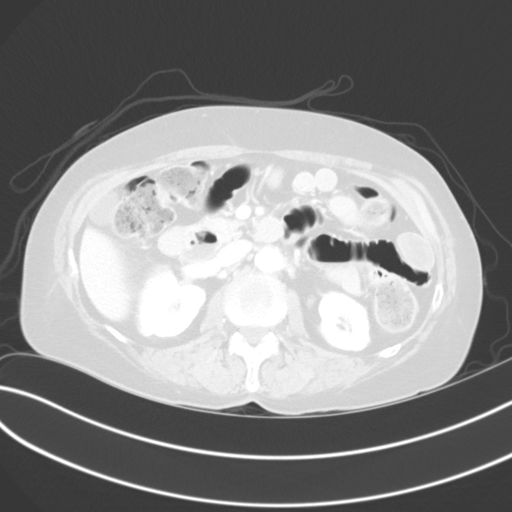
[im 81/97  soft-tissue]
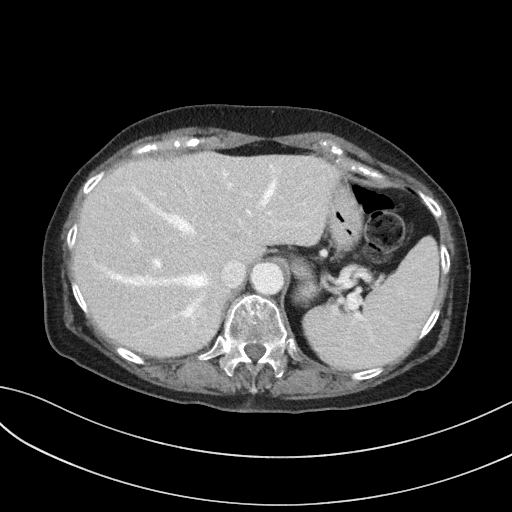
[im 81/97  lung]
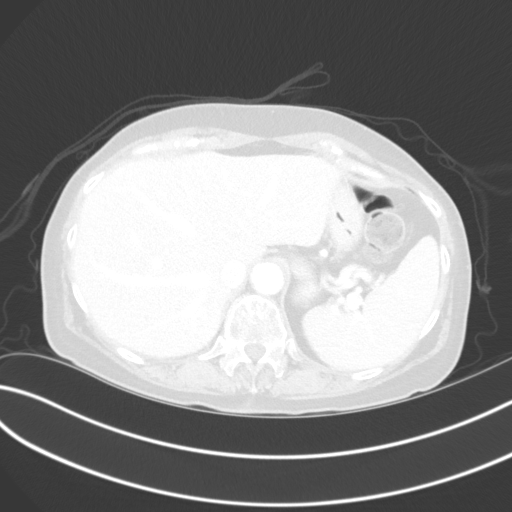

[Series 12: axial (person_name) (person_name) · axial · 0.72mm/px · z∈[-407,-247]mm · 3 of 97 slices shown]
[im 17/97  soft-tissue]
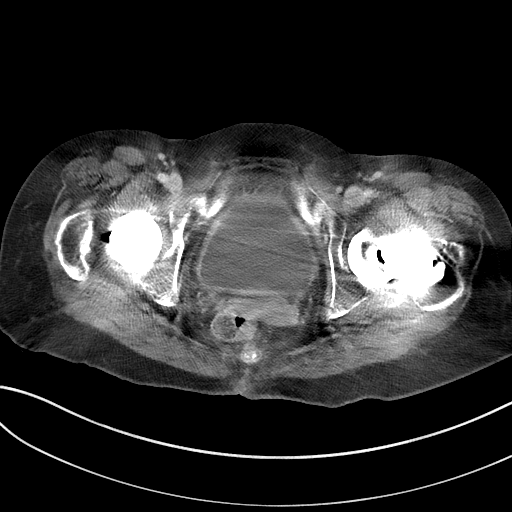
[im 33/97  soft-tissue]
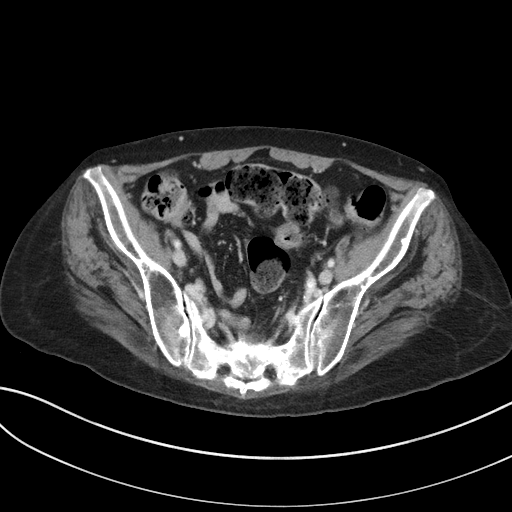
[im 49/97  soft-tissue]
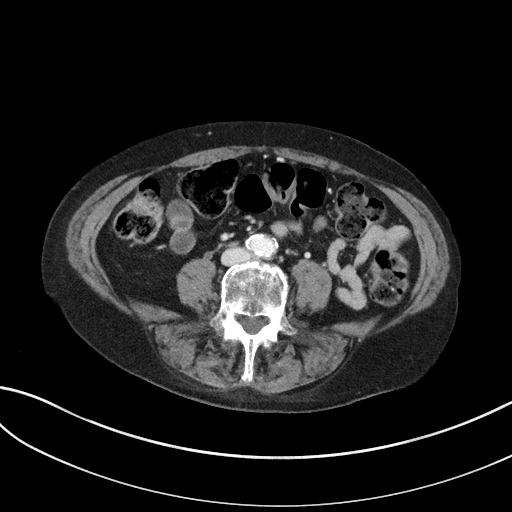

[10 of 46 positions shown; findings below may reference images not displayed]

FINDINGS: Lower chest: Stable lingular scarring. No acute abnormality at the
lung bases. Coronary atherosclerosis.

Hepatobiliary: Normal liver with no liver mass. Normal gallbladder
with no radiopaque cholelithiasis. No biliary ductal dilatation.
Small to moderate periampullary duodenal diverticulum, unchanged.

Pancreas: Normal, with no mass or duct dilation.

Spleen: Normal size. No mass.

Adrenals/Urinary Tract: Normal adrenals. No renal stones. No left
hydronephrosis. Mild fullness of the central right renal collecting
system without overt right hydronephrosis, decreased since
10/21/2016. Normal caliber ureters, with no evidence of ureteral
stones, noting limited visualization of the distal pelvic ureters
bilaterally due to metal streak artifact. Hypodense subcentimeter
renal cortical lesions in both kidneys, too small to characterize,
for which no follow-up is required. On delayed imaging, there is no
urothelial wall thickening and there are no filling defects in the
opacified portions of the bilateral collecting systems or ureters.
Limited bladder visualization due to streak artifact from bilateral
hip hardware. Tiny focus of gas in the nondependent bladder. No
evidence of bladder stones, wall thickening, masses or diverticula.

Stomach/Bowel: Grossly normal stomach. Normal caliber small bowel
with no small bowel wall thickening. Normal appendix. Normal large
bowel with no diverticulosis, large bowel wall thickening or
pericolonic fat stranding.

Vascular/Lymphatic: Atherosclerotic nonaneurysmal abdominal aorta.
Patent portal, splenic, hepatic and renal veins. No pathologically
enlarged lymph nodes in the abdomen or pelvis.

Reproductive: Grossly normal uterus. No left adnexal mass. Stable
coarse 1.0 cm right ovarian calcification resembling a tooth,
suggesting a small chronic stable right ovarian dermoid.

Other: No pneumoperitoneum, ascites or focal fluid collection.
Partially visualized right mastectomy.

Musculoskeletal: No aggressive appearing focal osseous lesions.
Partially visualized bilateral total hip arthroplasty. Patchy
sclerosis in the bilateral sacrum appears largely new since
10/21/2016. Moderate to severe L3 and L4 vertebral compression
fractures are stable. Mild thoracolumbar spondylosis.
IMPRESSION: 1. No evidence of urolithiasis or urothelial lesions, with
limitations as described. No suspicious renal cortical masses.
2. Mild fullness of the central right renal collecting system
without overt right hydronephrosis, decreased in the interval. No
left hydronephrosis.
3. Tiny focus of gas in the nondependent bladder, nonspecific. If
there is no history of recent bladder instrumentation to explain the
gas, recommend correlation with urinalysis to exclude gas producing
cystitis.
4. Patchy sclerosis in the bilateral sacrum, largely new since
10/21/2016 CT, nonspecific. Findings may represent subacute
bilateral sacral insufficiency fractures. Metastatic disease is less
likely but not entirely excluded. Consider further evaluation with
bone protocol MRI pelvis without and with IV contrast.
5.  Aortic Atherosclerosis ([id number]).  Coronary atherosclerosis.

## 2020-04-19 DEATH — deceased
